# Patient Record
Sex: Male | Born: 1945
Health system: Southern US, Community
[De-identification: ages and names within clinical notes are randomized; demographics above are authoritative.]

## PROBLEM LIST (undated history)

## (undated) DIAGNOSIS — M1990 Unspecified osteoarthritis, unspecified site: Secondary | ICD-10-CM

## (undated) DIAGNOSIS — E785 Hyperlipidemia, unspecified: Secondary | ICD-10-CM

## (undated) DIAGNOSIS — Z9049 Acquired absence of other specified parts of digestive tract: Secondary | ICD-10-CM

## (undated) HISTORY — PX: APPENDECTOMY: SHX54

## (undated) HISTORY — DX: Acquired absence of other specified parts of digestive tract: Z90.49

## (undated) HISTORY — PX: HERNIA REPAIR: SHX51

## (undated) HISTORY — DX: Hyperlipidemia, unspecified: E78.5

## (undated) HISTORY — DX: Unspecified osteoarthritis, unspecified site: M19.90

---

## 2014-05-18 DIAGNOSIS — J209 Acute bronchitis, unspecified: Secondary | ICD-10-CM | POA: Diagnosis not present

## 2015-01-31 DIAGNOSIS — H5210 Myopia, unspecified eye: Secondary | ICD-10-CM | POA: Diagnosis not present

## 2015-01-31 DIAGNOSIS — H5203 Hypermetropia, bilateral: Secondary | ICD-10-CM | POA: Diagnosis not present

## 2015-06-15 DIAGNOSIS — J00 Acute nasopharyngitis [common cold]: Secondary | ICD-10-CM | POA: Diagnosis not present

## 2015-06-15 DIAGNOSIS — J3089 Other allergic rhinitis: Secondary | ICD-10-CM | POA: Diagnosis not present

## 2015-07-31 DEATH — deceased

## 2016-04-05 DIAGNOSIS — H6123 Impacted cerumen, bilateral: Secondary | ICD-10-CM | POA: Diagnosis not present

## 2016-04-26 DIAGNOSIS — Z1211 Encounter for screening for malignant neoplasm of colon: Secondary | ICD-10-CM | POA: Diagnosis not present

## 2016-04-26 DIAGNOSIS — Z86010 Personal history of colonic polyps: Secondary | ICD-10-CM | POA: Diagnosis not present

## 2016-06-28 DIAGNOSIS — K641 Second degree hemorrhoids: Secondary | ICD-10-CM | POA: Diagnosis not present

## 2016-06-28 DIAGNOSIS — K5730 Diverticulosis of large intestine without perforation or abscess without bleeding: Secondary | ICD-10-CM | POA: Diagnosis not present

## 2016-06-28 DIAGNOSIS — Z1211 Encounter for screening for malignant neoplasm of colon: Secondary | ICD-10-CM | POA: Diagnosis not present

## 2016-06-28 DIAGNOSIS — Z86010 Personal history of colonic polyps: Secondary | ICD-10-CM | POA: Diagnosis not present

## 2016-08-20 DIAGNOSIS — Q2731 Arteriovenous malformation of vessel of upper limb: Secondary | ICD-10-CM | POA: Diagnosis not present

## 2016-08-20 DIAGNOSIS — L728 Other follicular cysts of the skin and subcutaneous tissue: Secondary | ICD-10-CM | POA: Diagnosis not present

## 2016-10-07 DIAGNOSIS — D485 Neoplasm of uncertain behavior of skin: Secondary | ICD-10-CM | POA: Diagnosis not present

## 2016-10-07 DIAGNOSIS — Z6826 Body mass index (BMI) 26.0-26.9, adult: Secondary | ICD-10-CM | POA: Diagnosis not present

## 2016-10-14 DIAGNOSIS — L980 Pyogenic granuloma: Secondary | ICD-10-CM | POA: Diagnosis not present

## 2017-01-23 ENCOUNTER — Encounter: Payer: Self-pay | Admitting: Medical

## 2017-01-23 ENCOUNTER — Ambulatory Visit (HOSPITAL_BASED_OUTPATIENT_CLINIC_OR_DEPARTMENT_OTHER)
Admission: RE | Admit: 2017-01-23 | Discharge: 2017-01-23 | Disposition: A | Discharge status: Home / Self Care | Payer: Medicare HMO | Source: Ambulatory Visit | Attending: Medical | Admitting: Medical

## 2017-01-23 ENCOUNTER — Ambulatory Visit (INDEPENDENT_AMBULATORY_CARE_PROVIDER_SITE_OTHER): Payer: Medicare HMO | Admitting: Medical

## 2017-01-23 VITALS — BP 127/77 | HR 70 | Ht 72.0 in | Wt 205.4 lb

## 2017-01-23 DIAGNOSIS — M50223 Other cervical disc displacement at C6-C7 level: Secondary | ICD-10-CM | POA: Diagnosis not present

## 2017-01-23 DIAGNOSIS — E785 Hyperlipidemia, unspecified: Secondary | ICD-10-CM

## 2017-01-23 DIAGNOSIS — M542 Cervicalgia: Secondary | ICD-10-CM

## 2017-01-23 DIAGNOSIS — R42 Dizziness and giddiness: Secondary | ICD-10-CM | POA: Diagnosis not present

## 2017-01-23 DIAGNOSIS — M50323 Other cervical disc degeneration at C6-C7 level: Secondary | ICD-10-CM | POA: Diagnosis not present

## 2017-01-23 DIAGNOSIS — S46812A Strain of other muscles, fascia and tendons at shoulder and upper arm level, left arm, initial encounter: Secondary | ICD-10-CM

## 2017-01-23 MED ORDER — CYCLOBENZAPRINE HCL 5 MG PO TABS: 5.0000 mg | ORAL_TABLET | Freq: Every day | ORAL | 0 refills | Status: DC

## 2017-01-23 MED ORDER — MECLIZINE HCL 12.5 MG PO TABS: 12.5000 mg | ORAL_TABLET | Freq: Three times a day (TID) | ORAL | 0 refills | Status: DC | PRN

## 2017-01-23 NOTE — Patient Instructions (Addendum)
For your neck pain will get cervical spine x-ray.  A lot of your pain appeared to be in the left trapezius region.  I offered Toradol injfectin  today  but was declined.  If you change your mind please let us know and I could arrange a nurse office visit.  You can continue to use low-dose Advil over-the-counter and I am making a  prescription of Flexeril available to use at night.  If you were to get neck stiffness with headache then would advise emergency department evaluation.  Your dizziness by history and exam today support benign cause.  I do not think you have a central cause presently.  I would recommend trying Epley maneuver exercise and cautioned that you get your balance after changing positions(before you ambulate).  If with dizziness you get associated headache or neurologic signs or symptoms as we discussed today then be seen at the emergency department.  Also if your dizziness does not taper off I might consider CT of the head outpatient order.  I can also print prescription of meclizine.  If you have dizziness that persist/lasting than more than 5 minutes then you could try this.  Follow-up in 7-10 days or as needed.  We will call you on the x-ray results.    How to Perform the Epley Maneuver The Epley maneuver is an exercise that relieves symptoms of vertigo. Vertigo is the feeling that you or your surroundings are moving when they are not. When you feel vertigo, you may feel like the room is spinning and have trouble walking. Dizziness is a little different than vertigo. When you are dizzy, you may feel unsteady or light-headed. You can do this maneuver at home whenever you have symptoms of vertigo. You can do it up to 3 times a day until your symptoms go away. Even though the Epley maneuver may relieve your vertigo for a few weeks, it is possible that your symptoms will return. This maneuver relieves vertigo, but it does not relieve dizziness. What are the risks? If it is done correctly,  the Epley maneuver is considered safe. Sometimes it can lead to dizziness or nausea that goes away after a short time. If you develop other symptoms, such as changes in vision, weakness, or numbness, stop doing the maneuver and call your health care provider. How to perform the Epley maneuver 1. Sit on the edge of a bed or table with your back straight and your legs extended or hanging over the edge of the bed or table. 2. Turn your head halfway toward the affected ear or side. 3. Lie backward quickly with your head turned until you are lying flat on your back. You may want to position a pillow under your shoulders. 4. Hold this position for 30 seconds. You may experience an attack of vertigo. This is normal. 5. Turn your head to the opposite direction until your unaffected ear is facing the floor. 6. Hold this position for 30 seconds. You may experience an attack of vertigo. This is normal. Hold this position until the vertigo stops. 7. Turn your whole body to the same side as your head. Hold for another 30 seconds. 8. Sit back up. You can repeat this exercise up to 3 times a day. Follow these instructions at home:  After doing the Epley maneuver, you can return to your normal activities.  Ask your health care provider if there is anything you should do at home to prevent vertigo. He or she may recommend that  you: ? Keep your head raised (elevated) with two or more pillows while you sleep. ? Do not sleep on the side of your affected ear. ? Get up slowly from bed. ? Avoid sudden movements during the day. ? Avoid extreme head movement, like looking up or bending over. Contact a health care provider if:  Your vertigo gets worse.  You have other symptoms, including: ? Nausea. ? Vomiting. ? Headache. Get help right away if:  You have vision changes.  You have a severe or worsening headache or neck pain.  You cannot stop vomiting.  You have new numbness or weakness in any part of your  body. Summary  Vertigo is the feeling that you or your surroundings are moving when they are not.  The Epley maneuver is an exercise that relieves symptoms of vertigo.  If the Epley maneuver is done correctly, it is considered safe. You can do it up to 3 times a day. This information is not intended to replace advice given to you by your health care provider. Make sure you discuss any questions you have with your health care provider. Document Released: 03/23/2013 Document Revised: 02/06/2016 Document Reviewed: 02/06/2016 Elsevier Interactive Patient Education  2017 [employer].

## 2017-01-23 NOTE — Progress Notes (Addendum)
Subjective:    Patient ID: Sergio Chandler, male    DOB: Sep 19, 1945, 71 y.o.   MRN: [id number]  HPI  Pt in for first time. [facility]2 [facility] a [facility]. Pt also saw Dr. Cathi Roan. He was formerly living in [place].   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 Married.    Pt brings [facility] paperwork with him with some recent labs.  Pt will see Dr. Lorelei Pont on April 01, 2017.   Pt not on any medications.  He has history of well controlled blood pressure. He states he has some cholesterol level vary between 210-240. He does not want to be on any medications.   Pt mom passed away 38 years of age. Dad passed away 101 years.  Pt in with some neck pain. He states pain started on left side of his neck then eventually went to his rt side. Pt thinks not related to poor sleep position. No injury or fall. No mva. No hx of neck pain in past per pt. Pt tried so advil and it did not help much. Pain is all day. Pt states dull pain. Pain increased with head movement of any sort. No pain running to his arms. No numbness, no weakness and no tingling.  Pain has been going on in neck for 5 weeks with some radiating pain to both scapulas/and shoulders.  Pt states dull level pain.   Pt has intermittent rare episodes of dizziness. Last 3-5 seconds. Seems to correlate with head movement at times but not present today during interview.  Last time dizzy this morning was when he got out of bed.   No fever, no chills or sweat. No nausea or vomiting. No headache.  Hx of known arthritis hips and knees.   On review review no severe rapid changes.   Review of Systems  Constitutional: Negative for chills, fatigue and fever.  Respiratory: Negative for cough, chest tightness, shortness of breath and wheezing.   Cardiovascular: Negative for chest pain and palpitations.  Gastrointestinal: Negative for abdominal pain.  Genitourinary:  Negative for decreased urine volume, dysuria, flank pain, frequency, hematuria, penile pain, penile swelling, scrotal swelling and testicular pain.  Musculoskeletal: Positive for neck pain. Negative for back pain, joint swelling and neck stiffness.  Skin: Negative for rash.  Neurological: Positive for dizziness. Negative for syncope, speech difficulty, weakness, numbness and headaches.       Some vertigo on turning his head and transient changing his position.  Hematological: Negative for adenopathy. Does not bruise/bleed easily.  Psychiatric/Behavioral: Negative for behavioral problems, confusion, dysphoric mood and sleep disturbance. The patient is not nervous/anxious.     Past Medical History:  Diagnosis Date  . Arthritis   . Hyperlipidemia      Social History   Social History  . Marital status: Married    Spouse name: N/A  . Number of children: N/A  . Years of education: N/A   Occupational History  . Not on file.   Social History Main Topics  . Smoking status: Former Smoker    Packs/day: 2.00    Years: 28.00    Types: Cigarettes, Pipe, Cigars    Quit date: 01/24/1979  . Smokeless tobacco: Never Used  . Alcohol use Yes     Comment: 4-6 beers a day.  . Drug use: No  . Sexual activity: Not on file   Other Topics Concern  .  Not on file   Social History Narrative  . No narrative on file    Past Surgical History:  Procedure Laterality Date  . APPENDECTOMY    . HERNIA REPAIR Right     History reviewed. No pertinent family history.  Not on File  No current outpatient prescriptions on file prior to visit.   No current facility-administered medications on file prior to visit.     Ht 6' (1.829 m)   Wt 205 lb 6.4 oz (93.2 kg)   BMI 27.86 kg/m       Objective:   Physical Exam  General Mental Status- Alert. General Appearance- Not in acute distress.   Skin General: Color- Normal Color. Moisture- Normal Moisture.  Neck Carotid Arteries- Normal color.  Moisture- Normal Moisture. No carotid bruits. No JVD. Left side trapezius tenderness. Pain on range of motion. No mid cspine tenderness presently.   Chest and Lung Exam Auscultation: Breath Sounds:-Normal.  Cardiovascular Auscultation:Rythm- Regular. Murmurs & Other Heart Sounds:Auscultation of the heart reveals- No Murmurs.  Abdomen Inspection:-Inspeection Normal. Palpation/Percussion:Note:No mass. Palpation and Percussion of the abdomen reveal- Non Tender, Non Distended + BS, no rebound or guarding.    Neurologic Cranial Nerve exam:- CN III-XII intact(No nystagmus), symmetric smile. Drift Test:- No drift. Romberg Exam:- Negative.  Heal to Toe Gait exam:-poor for years. Knee arthritis throws off balance.No change per pt. Finger to Nose:- Normal/Intact Strength:- 5/5 equal and symmetric strength both upper and lower extremities.l         Assessment & Plan:  For your neck pain will get cervical spine x-ray.  A lot of your pain appeared to be in the left trapezius region.  I offered Toradol injfectin  today  but was declined.  If you change your mind please let us know and I could arrange a nurse office visit.  You can continue to use low-dose Advil over-the-counter and I am making a prescription of Flexeril available to use at night.  If you were to get neck stiffness with headache then would advise emergency department evaluation.  Your dizziness by history and exam today support benign cause.  I do not think you have a central cause presently.  I would recommend trying Epley maneuver exercise and cautioned that you get your balance after changing positions.  If with dizziness you get associated headache or neurologic signs or symptoms as we discussed today then be seen at the emergency department.  Also if your dizziness does not taper off I might consider CT of the head outpatient order.  I can also print prescription of meclizine.  If you have dizziness that persist/lasting than  more than 5 minutes then you could try this.  Follow-up in 7-10 days or as needed.  We will call you on the x-ray results.  Cliff Damiani, Percell Miller, PA-C

## 2017-01-24 ENCOUNTER — Telehealth: Payer: Self-pay

## 2017-01-24 NOTE — Telephone Encounter (Signed)
I called patient between patient's but did not get through.  However, I did leave a message on his answering machine.  Basically tried to explain that I would order an MRI if he had pain from his neck shooting towards either arm.  I try to explain that with radiating pain to his arms from his neck that insurances may authorize MRI under those circumstances.  Without any pain shooting to his arms, the test may be declined.  Also left a message asking if he had tried the Flexeril and if that helped his neck pain.  Would you try calling patient and touching base with him regarding the above.  Let me know since if he does have radiating pain to his arms I I feel like I could get MRI authorized.

## 2017-01-24 NOTE — Telephone Encounter (Signed)
Pt want to know of he will need and MRI CT Scan for neck pain. Please advise.

## 2017-01-27 NOTE — Telephone Encounter (Signed)
Relation to VQ:[id number] Call back number: [phone] Pharmacy:  Reason for call:  Patient states he spoke to insurance and they advised him, if PCP codes it as "x ray" then patient would be responsible for $10 and if he codes it as "MRI" he will be responsible for $220, please advise

## 2017-01-27 NOTE — Telephone Encounter (Signed)
Pt states he is more worried about the dizziness he is having and where is is coming from if he needs to do the MRI to find out he will do it.

## 2017-01-27 NOTE — Telephone Encounter (Signed)
Patient should be made aware that the advice he was given by insurance really does not make any sense.  That MRI is a totally different test than an x-ray.  MRI cannot be coded as an x-ray.  I can order the MRI code/reason for test would be neck pain.  Cost of MRI more expensive than the x-ray.  I am hesitant to order the test if he is under the assumption that it will cost him $10 and when test cost more then  he might take a coded the test wrong.   Patient of the above and that I would like to talk to him.  Would you get please call that number for him.

## 2017-01-28 ENCOUNTER — Telehealth: Payer: Self-pay | Admitting: Medical

## 2017-01-28 DIAGNOSIS — M542 Cervicalgia: Secondary | ICD-10-CM

## 2017-01-28 NOTE — Telephone Encounter (Signed)
Pt. States that he does not care what needs to be done with MRI and insurance. He needs to find out what the problem is and why he is dizzy. He just want to get something done.

## 2017-01-28 NOTE — Telephone Encounter (Signed)
Patient best contact [phone]

## 2017-01-28 NOTE — Telephone Encounter (Signed)
I put an MRI order for patient to have done.  Please try to get that authorized.  It probably is less expensive to do it somewhere else than here.  Patient was mentioning hospital-based charge etc.  So can you coordinate and get that done at a location that would be cheaper/best for him.  This is what he requests.

## 2017-01-29 ENCOUNTER — Encounter: Payer: Self-pay | Admitting: Medical

## 2017-01-30 NOTE — Telephone Encounter (Signed)
Pt mri results of cspine?

## 2017-01-30 NOTE — Telephone Encounter (Signed)
Patient's MRI c-spine is scheduled but it has not been done.  I will be out of the office next week and I think patient has appointment scheduled way out to see Dr. Edilia Bo but a month or 2 out?  He was expressing frustration about his continued neck pain and he was stating  he was still dizzy.   If he calls regarding persistent pain or dizziness try to get him in with 1 of the other providers when they are doctor of the day.  May be Dr. Edilia Bo has Thursday with opening?  Patient declined Toradol on the day that I saw him(wanted to see if that helped his neck pain)..  And I was considering may be getting CT of the head to further evaluate his dizziness if Epley maneuvers and meclizine did not help.  Please call him on Monday and see how he is doing?

## 2017-01-30 NOTE — Telephone Encounter (Signed)
Patient is scheduled w/ [facility] for 02/11/17

## 2017-01-31 NOTE — Telephone Encounter (Signed)
Pt notified and scheduled for CT

## 2017-02-05 DIAGNOSIS — H5203 Hypermetropia, bilateral: Secondary | ICD-10-CM | POA: Diagnosis not present

## 2017-02-11 ENCOUNTER — Ambulatory Visit
Admission: RE | Admit: 2017-02-11 | Discharge: 2017-02-11 | Disposition: A | Discharge status: Home / Self Care | Payer: Medicare HMO | Source: Ambulatory Visit | Attending: Medical | Admitting: Medical

## 2017-02-11 ENCOUNTER — Ambulatory Visit
Admission: RE | Admit: 2017-02-11 | Discharge: 2017-02-11 | Disposition: A | Discharge status: Home / Self Care | Payer: Medicare HMO | Source: Ambulatory Visit | Attending: Radiology | Admitting: Radiology

## 2017-02-11 ENCOUNTER — Other Ambulatory Visit: Payer: Self-pay | Admitting: Radiology

## 2017-02-11 DIAGNOSIS — M4802 Spinal stenosis, cervical region: Secondary | ICD-10-CM | POA: Diagnosis not present

## 2017-02-11 DIAGNOSIS — M542 Cervicalgia: Secondary | ICD-10-CM

## 2017-02-11 DIAGNOSIS — Z0189 Encounter for other specified special examinations: Secondary | ICD-10-CM

## 2017-02-11 DIAGNOSIS — Z1389 Encounter for screening for other disorder: Secondary | ICD-10-CM

## 2017-02-11 DIAGNOSIS — Z01818 Encounter for other preprocedural examination: Secondary | ICD-10-CM | POA: Diagnosis not present

## 2017-02-17 ENCOUNTER — Ambulatory Visit: Payer: Medicare HMO | Admitting: Medical

## 2017-02-17 ENCOUNTER — Encounter: Payer: Self-pay | Admitting: Medical

## 2017-02-17 VITALS — BP 117/77 | HR 70 | Temp 97.6°F | Resp 16 | Ht 74.0 in | Wt 207.0 lb

## 2017-02-17 DIAGNOSIS — M542 Cervicalgia: Secondary | ICD-10-CM

## 2017-02-17 MED ORDER — TRAMADOL HCL 50 MG PO TABS: 50.0000 mg | ORAL_TABLET | Freq: Four times a day (QID) | ORAL | 0 refills | Status: DC | PRN

## 2017-02-17 NOTE — Progress Notes (Signed)
Subjective:    Patient ID: Sergio Chandler, male    DOB: [birth year], 71 y.o.   MRN: [id number]  HPI  Pt in for persisting neck pain. Mri reviewed with pt. Multiple findings explained with pt. He better understanding of why he may be in pain. No radiating pain to his arms.   He is willing to see Dr. Ellene Route neurosurgeon. He has appointment late December.  Below explained to hm.  1. Widespread chronic cervical disc and endplate degeneration with no No acute osseous abnormality identified. Intermittent posterior element degeneration, maximal at C5-C6 and C6-C7. 2. Multilevel mild cervical spinal stenosis with no definite spinal cord mass effect. No cord signal abnormality. 3. Widespread moderate cervical neural foraminal stenosis, and severe stenosis at the left C6 nerve level.    Pt states pain is not that severe. He did not like the way muscle relaxant made him feel.  Pt states in past with pain would just use advil. 2 tabs and is adequate for other pains.  Pt recent dizziness he explained is now resolved. Only very transient for seconds 5 or so then resolves on and off. Not associated with any gross motor or neurologic signs or symptoms.   Review of Systems  Constitutional: Negative for chills, fatigue and fever.  Respiratory: Negative for chest tightness, shortness of breath and wheezing.   Cardiovascular: Negative for chest pain and palpitations.  Gastrointestinal: Negative for abdominal pain, constipation, diarrhea and vomiting.  Musculoskeletal: Positive for neck pain. Negative for arthralgias, back pain and joint swelling.  Skin: Negative for rash.  Neurological: Negative for dizziness, syncope, numbness and headaches.  Hematological: Negative for adenopathy. Does not bruise/bleed easily.  Psychiatric/Behavioral: Negative for behavioral problems, decreased concentration, self-injury and suicidal ideas. The patient is not nervous/anxious.     Past Medical History:    Diagnosis Date  . Arthritis   . Hyperlipidemia      Social History   Socioeconomic History  . Marital status: Married    Spouse name: Not on file  . Number of children: Not on file  . Years of education: Not on file  . Highest education level: Not on file  Social Needs  . Financial resource strain: Not on file  . Food insecurity - worry: Not on file  . Food insecurity - inability: Not on file  . Transportation needs - medical: Not on file  . Transportation needs - non-medical: Not on file  Occupational History  . Not on file  Tobacco Use  . Smoking status: Former Smoker    Packs/day: 2.00    Years: 28.00    Pack years: 56.00    Types: Cigarettes, Pipe, Cigars    Last attempt to quit: 01/24/1979    Years since quitting: 38.0  . Smokeless tobacco: Never Used  Substance and Sexual Activity  . Alcohol use: Yes    Comment: 4-6 beers a day.  . Drug use: No  . Sexual activity: Not on file  Other Topics Concern  . Not on file  Social History Narrative  . Not on file    Past Surgical History:  Procedure Laterality Date  . APPENDECTOMY    . HERNIA REPAIR Right     No family history on file.  Not on File  Current Outpatient Medications on File Prior to Visit  Medication Sig Dispense Refill  . cyclobenzaprine (FLEXERIL) 5 MG tablet Take 1 tablet (5 mg total) by mouth at bedtime. 10 tablet 0  . meclizine (ANTIVERT) 12.5 MG  tablet Take 1 tablet (12.5 mg total) by mouth 3 (three) times daily as needed for dizziness. 30 tablet 0   No current facility-administered medications on file prior to visit.     BP 117/77   Pulse 70   Temp 97.6 F (36.4 C)   Resp 16   Ht 6\' 2"  (1.88 m)   Wt 207 lb (93.9 kg)   SpO2 99%   BMI 26.58 kg/m       Objective:   Physical Exam   General Mental Status- Alert. General Appearance- Not in acute distress.   Skin General: Color- Normal Color. Moisture- Normal Moisture.  Neck Carotid Arteries- Normal color. Moisture- Normal  Moisture. No carotid bruits. No JVD. No mid cervical or trapezius tenderness presently.  Chest and Lung Exam Auscultation: Breath Sounds:-Normal.  Cardiovascular Auscultation:Rythm- Regular. Murmurs & Other Heart Sounds:Auscultation of the heart reveals- No Murmurs.  Abdomen Inspection:-Inspeection Normal. Palpation/Percussion:Note:No mass. Palpation and Percussion of the abdomen reveal- Non Tender, Non Distended + BS, no rebound or guarding.   Neurologic Cranial Nerve exam:- CN III-XII intact(No nystagmus), symmetric smile. Strength:- 5/5 equal and symmetric strength both upper and lower extremities.     Assessment & Plan:  For neck pain mild-moderate at time can use advil. If worse or more severe pain making tramadol available.(rx advisement given). Refer to Dr. Ellene Route.  If your dizziness becomes more constant or severe please let us know. If any dizziness with motor or sensory deficits then ED evaluation.  Follow up with [place] as needed post neurosurgeon appointment.

## 2017-02-17 NOTE — Patient Instructions (Signed)
For neck pain mild-moderate at time can use advil. If worse or more severe pain making tramadol available.(rx advisement given). Refer to Dr. Ellene Route.  If your dizziness becomes more constant or severe please let us know. If any dizziness with motor or sensory deficits then ED evaluation.  Follow up with [place] as needed post neurosurgeon appointment.

## 2017-03-04 DIAGNOSIS — M47812 Spondylosis without myelopathy or radiculopathy, cervical region: Secondary | ICD-10-CM | POA: Diagnosis not present

## 2017-03-07 DIAGNOSIS — M542 Cervicalgia: Secondary | ICD-10-CM | POA: Diagnosis not present

## 2017-03-07 DIAGNOSIS — M47892 Other spondylosis, cervical region: Secondary | ICD-10-CM | POA: Diagnosis not present

## 2017-03-13 DIAGNOSIS — H6123 Impacted cerumen, bilateral: Secondary | ICD-10-CM | POA: Diagnosis not present

## 2017-03-18 DIAGNOSIS — G629 Polyneuropathy, unspecified: Secondary | ICD-10-CM | POA: Diagnosis not present

## 2017-03-18 DIAGNOSIS — R29898 Other symptoms and signs involving the musculoskeletal system: Secondary | ICD-10-CM | POA: Diagnosis not present

## 2017-03-18 DIAGNOSIS — M9981 Other biomechanical lesions of cervical region: Secondary | ICD-10-CM | POA: Diagnosis not present

## 2017-03-19 DIAGNOSIS — M542 Cervicalgia: Secondary | ICD-10-CM | POA: Diagnosis not present

## 2017-03-19 DIAGNOSIS — M47892 Other spondylosis, cervical region: Secondary | ICD-10-CM | POA: Diagnosis not present

## 2017-03-27 DIAGNOSIS — M542 Cervicalgia: Secondary | ICD-10-CM | POA: Diagnosis not present

## 2017-03-27 DIAGNOSIS — M47892 Other spondylosis, cervical region: Secondary | ICD-10-CM | POA: Diagnosis not present

## 2017-04-02 DIAGNOSIS — M542 Cervicalgia: Secondary | ICD-10-CM | POA: Diagnosis not present

## 2017-04-02 DIAGNOSIS — M47892 Other spondylosis, cervical region: Secondary | ICD-10-CM | POA: Diagnosis not present

## 2017-04-09 NOTE — Progress Notes (Signed)
Sergio Chandler at [facility] [address], [place], [place] [address] [phone]  Date:  04/10/2017   Name:  Sergio Chandler   DOB:  06/10/1945   MRN:  [id number]  PCP:  Sergio Mclean, MD    Chief Complaint: Establish Care (Pt states he has no questions or concerns. Reestablishing care. )   History of Present Illness:  Sergio Chandler is a 72 y.o. very pleasant male patient who presents with the following:  Here today as a new patient to establish care History of hyperlipidemia and arthritis  Pt declines all immunizations  He had an MRI of his spine in November: IMPRESSION: 1. Widespread chronic cervical disc and endplate degeneration with no No acute osseous abnormality identified. Intermittent posterior element degeneration, maximal at C5-C6 and C6-C7. 2. Multilevel mild cervical spinal stenosis with no definite spinal cord mass effect. No cord signal abnormality. 3. Widespread moderate cervical neural foraminal stenosis, and severe stenosis at the left C6 nerve level.  He has been referred to see NSG, and is also seeing PT through [facility]  He moved here last fall- he moved from the [place] area. They sold their home and got a townhouse, downsizing.   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   He also sees the [facility] in [place] once a year, and has labs per them He notes that his cholesterol goes "up and down," but he does not take any medication for this  He does take fish oil He had an exlap back in [place]- he had a lacerated liver He was in [place] for about 4 months His mother and father are deceased, he has one remaining sister living His parents lived to 26 (mom) and 81 (dad)  He does have 2 sons, no grands  In his free time he likes to travel.    He has seen NSG, no operation needed, he is doing PT. His neck is feeling better He does not feel as dizzy as he did in the past, overall this is better.   His last colon was last year- he was asked to come back in 10 years  He does not do a lot of exercise, but he does do some walking when it suits him- when he is working or traveling  He enjoys traveling via [place]- this spring they are planing on [place] and [place], [place] then [place].  Next year they will see [place] Patient Active Problem List   Diagnosis Date Noted  . 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 Arthritis   . History of appendectomy   . Hyperlipidemia     Past Surgical History:  Procedure Laterality Date  . APPENDECTOMY    . HERNIA REPAIR Right     Social History   Tobacco Use  . Smoking status: Former Smoker    Packs/day: 2.00    Years: 28.00    Pack years: 56.00    Types: Cigarettes, Pipe, Cigars    Last attempt to quit: 01/24/1979    Years since quitting: 38.2  . Smokeless tobacco: Never Used  Substance Use Topics  . Alcohol use: Yes    Comment: 4-6 beers a day.  . Drug use:  No    No family history on file.  Not on File  Medication list has been reviewed and updated.  Current Outpatient Medications on File Prior to Visit  Medication Sig Dispense Refill  . traMADol (ULTRAM) 50 MG tablet Take 1 tablet (50 mg total) every 6 (six) hours as needed by mouth. 20 tablet 0   No current facility-administered medications on file prior to visit.     Review of Systems:  As per HPI- otherwise negative.   Physical Examination: Vitals:   04/10/17 0830  BP: 110/76  Pulse: 80  Temp: 97.8 F (36.6 C)  SpO2: 98%   Vitals:   04/10/17 0830  Weight: 210 lb (95.3 kg)  Height: 6' (1.829 m)   Body mass index is 28.48 kg/m. Ideal Body Weight: Weight in (lb) to have BMI = 25: 183.9  GEN: WDWN, NAD, Non-toxic, A & O x 3, looks  well, mild overweight HEENT: Atraumatic, Normocephalic. Neck supple. No masses, No LAD.  Bilateral TM wnl with some wax in canal, oropharynx normal.  PEERL,EOMI.   Ears and Nose: No external deformity. CV: RRR, No M/G/R. No JVD. No thrill. No extra heart sounds. PULM: CTA B, no wheezes, crackles, rhonchi. No retractions. No resp. distress. No accessory muscle use. ABD: S, NT, ND. No rebound. No HSM. EXTR: No c/c/e NEURO Normal gait.  PSYCH: Normally interactive. Conversant. Not depressed or anxious appearing.  Calm demeanor.    Assessment and Plan: Other osteoarthritis of spine, cervical region  Here today to establish care with me.  He does not have any concerns today Much of his care is through the [facility]- he wants to have someone local as well   Signed Lamar Blinks, MD

## 2017-04-10 ENCOUNTER — Encounter: Payer: Self-pay | Admitting: Family Medicine

## 2017-04-10 ENCOUNTER — Ambulatory Visit: Payer: Medicare HMO | Admitting: Family Medicine

## 2017-04-10 VITALS — BP 110/76 | HR 80 | Temp 97.8°F | Ht 72.0 in | Wt 210.0 lb

## 2017-04-10 DIAGNOSIS — M479 Spondylosis, unspecified: Secondary | ICD-10-CM | POA: Insufficient documentation

## 2017-04-10 DIAGNOSIS — M47892 Other spondylosis, cervical region: Secondary | ICD-10-CM | POA: Diagnosis not present

## 2017-04-10 NOTE — Patient Instructions (Addendum)
It was very nice to meet you today and hear about your travels!  Take care and let me know if there is anything we can do for you

## 2017-12-30 DIAGNOSIS — D225 Melanocytic nevi of trunk: Secondary | ICD-10-CM | POA: Diagnosis not present

## 2017-12-30 DIAGNOSIS — L821 Other seborrheic keratosis: Secondary | ICD-10-CM | POA: Diagnosis not present

## 2017-12-30 DIAGNOSIS — L570 Actinic keratosis: Secondary | ICD-10-CM | POA: Diagnosis not present

## 2017-12-30 DIAGNOSIS — X32XXXD Exposure to sunlight, subsequent encounter: Secondary | ICD-10-CM | POA: Diagnosis not present

## 2018-02-05 DIAGNOSIS — H5203 Hypermetropia, bilateral: Secondary | ICD-10-CM | POA: Diagnosis not present

## 2018-02-11 LAB — LIPID PANEL
CHOLESTEROL: 162 (ref 0–200)
HDL: 60 (ref 35–70)
LDL CALC: 87
TRIGLYCERIDES: 77 (ref 40–160)

## 2018-02-11 LAB — HEPATIC FUNCTION PANEL
ALK PHOS: 85 (ref 25–125)
ALT: 45 — AB (ref 10–40)
AST: 22 (ref 14–40)
BILIRUBIN DIRECT: 0.3 (ref 0.01–0.4)

## 2018-02-11 LAB — BASIC METABOLIC PANEL
BUN: 11 (ref 4–21)
CREATININE: 0.8 (ref 0.6–1.3)
Glucose: 109

## 2018-02-11 LAB — CBC AND DIFFERENTIAL
HCT: 44 (ref 41–53)
HEMOGLOBIN: 14.8 (ref 13.5–17.5)
Platelets: 219 (ref 150–399)
WBC: 5.8

## 2018-03-11 ENCOUNTER — Telehealth: Payer: Self-pay | Admitting: Family Medicine

## 2018-03-11 NOTE — Telephone Encounter (Signed)
Patient came in and dropped off blood test results from the [facility] on 03/11/18

## 2018-03-15 ENCOUNTER — Encounter: Payer: Self-pay | Admitting: Family Medicine

## 2018-03-20 ENCOUNTER — Encounter: Payer: Self-pay | Admitting: Family Medicine

## 2018-03-20 LAB — CALCIUM: CALCIUM: 9.1

## 2018-03-20 LAB — CHLORIDE: CHLORIDE: 104

## 2018-03-20 LAB — CO2, TOTAL: Carbon Dioxide, Total: 31

## 2018-03-20 LAB — ESTIMATED GFR

## 2018-05-01 DIAGNOSIS — D2339 Other benign neoplasm of skin of other parts of face: Secondary | ICD-10-CM | POA: Diagnosis not present

## 2018-05-01 DIAGNOSIS — C44622 Squamous cell carcinoma of skin of right upper limb, including shoulder: Secondary | ICD-10-CM | POA: Diagnosis not present

## 2018-05-25 ENCOUNTER — Telehealth: Payer: Self-pay | Admitting: Family Medicine

## 2018-05-25 ENCOUNTER — Ambulatory Visit (INDEPENDENT_AMBULATORY_CARE_PROVIDER_SITE_OTHER): Payer: Medicare HMO | Admitting: Medical

## 2018-05-25 ENCOUNTER — Ambulatory Visit (HOSPITAL_BASED_OUTPATIENT_CLINIC_OR_DEPARTMENT_OTHER)
Admission: RE | Admit: 2018-05-25 | Discharge: 2018-05-25 | Disposition: A | Discharge status: Home / Self Care | Payer: Medicare HMO | Source: Ambulatory Visit | Attending: Medical | Admitting: Medical

## 2018-05-25 ENCOUNTER — Encounter: Payer: Self-pay | Admitting: Medical

## 2018-05-25 VITALS — BP 124/75 | HR 75 | Temp 98.0°F | Resp 16 | Ht 72.0 in | Wt 212.4 lb

## 2018-05-25 DIAGNOSIS — J40 Bronchitis, not specified as acute or chronic: Secondary | ICD-10-CM

## 2018-05-25 DIAGNOSIS — R61 Generalized hyperhidrosis: Secondary | ICD-10-CM

## 2018-05-25 DIAGNOSIS — R059 Cough, unspecified: Secondary | ICD-10-CM

## 2018-05-25 DIAGNOSIS — E785 Hyperlipidemia, unspecified: Secondary | ICD-10-CM | POA: Diagnosis not present

## 2018-05-25 DIAGNOSIS — R05 Cough: Secondary | ICD-10-CM

## 2018-05-25 DIAGNOSIS — R042 Hemoptysis: Secondary | ICD-10-CM | POA: Diagnosis not present

## 2018-05-25 LAB — COMPREHENSIVE METABOLIC PANEL
ALK PHOS: 78 U/L (ref 39–117)
ALT: 20 U/L (ref 0–53)
AST: 15 U/L (ref 0–37)
Albumin: 4.1 g/dL (ref 3.5–5.2)
BILIRUBIN TOTAL: 0.9 mg/dL (ref 0.2–1.2)
BUN: 9 mg/dL (ref 6–23)
CO2: 32 meq/L (ref 19–32)
CREATININE: 0.73 mg/dL (ref 0.40–1.50)
Calcium: 9.4 mg/dL (ref 8.4–10.5)
Chloride: 103 mEq/L (ref 96–112)
GFR: 104.72 mL/min (ref >60.00)
GLUCOSE: 107 mg/dL — AB (ref 70–99)
Potassium: 4.3 mEq/L (ref 3.5–5.1)
SODIUM: 140 meq/L (ref 135–145)
TOTAL PROTEIN: 6.2 g/dL (ref 6.0–8.3)

## 2018-05-25 LAB — CBC WITH DIFFERENTIAL/PLATELET
BASOS ABS: 0 10*3/uL (ref 0.0–0.1)
Basophils Relative: 0.7 % (ref 0.0–3.0)
EOS ABS: 0.2 10*3/uL (ref 0.0–0.7)
Eosinophils Relative: 3.3 % (ref 0.0–5.0)
HCT: 43 % (ref 39.0–52.0)
Hemoglobin: 14.8 g/dL (ref 13.0–17.0)
LYMPHS ABS: 1.4 10*3/uL (ref 0.7–4.0)
Lymphocytes Relative: 24.9 % (ref 12.0–46.0)
MCHC: 34.4 g/dL (ref 30.0–36.0)
MCV: 88.6 fl (ref 78.0–100.0)
MONO ABS: 0.6 10*3/uL (ref 0.1–1.0)
MONOS PCT: 10.7 % (ref 3.0–12.0)
NEUTROS ABS: 3.4 10*3/uL (ref 1.4–7.7)
NEUTROS PCT: 60.4 % (ref 43.0–77.0)
PLATELETS: 205 10*3/uL (ref 150.0–400.0)
RBC: 4.85 Mil/uL (ref 4.22–5.81)
RDW: 13.1 % (ref 11.5–15.5)
WBC: 5.6 10*3/uL (ref 4.0–10.5)

## 2018-05-25 LAB — LIPID PANEL
CHOL/HDL RATIO: 3
Cholesterol: 164 mg/dL (ref 0–200)
HDL: 56.3 mg/dL (ref >39.00)
LDL Cholesterol: 91 mg/dL (ref 0–99)
NONHDL: 108.05
Triglycerides: 84 mg/dL (ref 0.0–149.0)
VLDL: 16.8 mg/dL (ref 0.0–40.0)

## 2018-05-25 MED ORDER — ALBUTEROL SULFATE HFA 108 (90 BASE) MCG/ACT IN AERS: 2.0000 | INHALATION_SPRAY | Freq: Four times a day (QID) | RESPIRATORY_TRACT | 2 refills | Status: DC | PRN

## 2018-05-25 MED ORDER — DOXYCYCLINE HYCLATE 100 MG PO TABS: ORAL_TABLET | ORAL | 0 refills | Status: DC

## 2018-05-25 MED ORDER — PREDNISONE 10 MG PO TABS: ORAL_TABLET | ORAL | 0 refills | Status: DC

## 2018-05-25 MED ORDER — BENZONATATE 100 MG PO CAPS: 100.0000 mg | ORAL_CAPSULE | Freq: Three times a day (TID) | ORAL | 0 refills | Status: DC | PRN

## 2018-05-25 NOTE — Patient Instructions (Addendum)
You appear to have bronchitis. Rest hydrate and tylenol for fever. I am prescribing cough medicine benzonatate and doxycycline  antibiotic. For nasal congestion rx flonase.  Will get chest xray today to evaluate if you have pneumonia.  For sob or wheezing, will rx albuterol inhaler. Making print rx prednisone available to use if needed for wheezing or shortness of breath if needed.  Will get labs today cbc, cmp and lipid panel.   Follow up in 7-10 days or as needed

## 2018-05-25 NOTE — Telephone Encounter (Signed)
Called [facility] back and they report they already received an answer from [place].

## 2018-05-25 NOTE — Telephone Encounter (Signed)
Copied from [place] [id number]. Topic: Quick Communication - See Telephone Encounter >> May 25, 2018  9:41 AM Bea Graff, NT wrote: CRM for notification. See Telephone encounter for: 05/25/18. [facility] calling and states they need the directions of how the pt should take the doxycycline (VIBRA-TABS) 100 MG tablet before they can fill. Please advise. [place], [place] [phone] (Phone) [phone] (Fax)

## 2018-05-25 NOTE — Telephone Encounter (Signed)
Copied from [place] [id number]. Topic: Quick Communication - Rx Refill/Question >> May 25, 2018  1:07 PM Windy Kalata wrote: Medication: doxycycline (VIBRA-TABS) 100 MG   Has the patient contacted their pharmacy? Yes.   (Agent: If no, request that the patient contact the pharmacy for the refill.) (Agent: If yes, when and what did the pharmacy advise?) Pharmacy needs a call with how to dispense  Preferred Pharmacy (with phone number or street name): [facility] [place], [place] [phone] (Phone) [phone] (Fax)    Agent: Please be advised that RX refills may take up to 3 business days. We ask that you follow-up with your pharmacy.

## 2018-05-25 NOTE — Telephone Encounter (Signed)
I called and clarified- 1 twice a day for 10 days

## 2018-05-25 NOTE — Progress Notes (Signed)
Subjective:    Patient ID: Sergio Chandler, male    DOB: Jun 12, 1945, 73 y.o.   MRN: [id number]  HPI  Pt in for some recent nasal and chest congestion. Pt states he was up in [place] working over past 2 weeks. He states mostly past week chest congestion is worse. He state he has saliva, mucus and blood mixed productive cough. 50% he said was blood on Friday(since then no recurrent hemoptysis). He states has felt more tired that usual. He was feeling more short of breath intermittently. Some sweating. Pt is former smoker.  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   Review of Systems  Constitutional: Positive for diaphoresis and fatigue. Negative for chills and fever.  HENT: Positive for congestion. Negative for ear pain and postnasal drip.   Respiratory: Positive for cough and shortness of breath. Negative for chest tightness and wheezing.   Cardiovascular: Negative for chest pain and palpitations.  Gastrointestinal: Negative for abdominal pain, nausea and vomiting.  Musculoskeletal: Negative for back pain and neck pain.  Skin: Negative for rash.  Neurological: Negative for dizziness, seizures, weakness and light-headedness.  Hematological: Negative for adenopathy. Does not bruise/bleed easily.  Psychiatric/Behavioral: Negative for behavioral problems and confusion.    Past Medical History:  Diagnosis Date  . Arthritis   . History of appendectomy   . Hyperlipidemia      Social History   Socioeconomic History  . Marital status: Married    Spouse name: Not on file  . Number of children: Not on file  . Years of education: Not on file  . Highest education level: Not on file  Occupational History  . Not on file  Social Needs  . Financial resource strain: Not on file  . Food insecurity:    Worry: Not on file    Inability: Not on file  . Transportation needs:    Medical: Not on file    Non-medical: Not on file  Tobacco Use  .  Smoking status: Former Smoker    Packs/day: 2.00    Years: 28.00    Pack years: 56.00    Types: Cigarettes, Pipe, Cigars    Last attempt to quit: 01/24/1979    Years since quitting: 39.3  . Smokeless tobacco: Never Used  Substance and Sexual Activity  . Alcohol use: Yes    Comment: 4-6 beers a day.  . Drug use: No  . Sexual activity: Not on file  Lifestyle  . Physical activity:    Days per week: Not on file    Minutes per session: Not on file  . Stress: Not on file  Relationships  . Social connections:    Talks on phone: Not on file    Gets together: Not on file    Attends religious service: Not on file    Active member of club or organization: Not on file    Attends meetings of clubs or organizations: Not on file    Relationship status: Not on file  . Intimate partner violence:    Fear of current or ex partner: Not on file    Emotionally abused: Not on file    Physically abused: Not on file    Forced sexual activity: Not on file  Other Topics Concern  . Not on file  Social History Narrative  . Not on file    Past Surgical History:  Procedure Laterality Date  . APPENDECTOMY    . HERNIA  REPAIR Right     No family history on file.  Not on File  Current Outpatient Medications on File Prior to Visit  Medication Sig Dispense Refill  . atorvastatin (LIPITOR) 20 MG tablet Take 20 mg by mouth daily.     No current facility-administered medications on file prior to visit.     BP 124/75   Pulse 75   Temp 98 F (36.7 C) (Oral)   Resp 16   Ht 6' (1.829 m)   Wt 212 lb 6.4 oz (96.3 kg)   SpO2 100%   BMI 28.81 kg/m       Objective:   Physical Exam  General  Mental Status - Alert. General Appearance - Well groomed. Not in acute distress.  Skin Rashes- No Rashes.  HEENT Head- Normal. Ear Auditory Canal - Left- Normal. Right - Normal.Tympanic Membrane- Left- Normal. Right- Normal. Eye Sclera/Conjunctiva- Left- Normal. Right- Normal. Nose & Sinuses Nasal  Mucosa- Left-  Boggy and Congested. Right-  Boggy and  Congested.Bilateral maxillary and frontal sinus pressure. Mouth & Throat Lips: Upper Lip- Normal: no dryness, cracking, pallor, cyanosis, or vesicular eruption. Lower Lip-Normal: no dryness, cracking, pallor, cyanosis or vesicular eruption. Buccal Mucosa- Bilateral- No Aphthous ulcers. Oropharynx- No Discharge or Erythema. Tonsils: Characteristics- Bilateral- No Erythema or Congestion. Size/Enlargement- Bilateral- No enlargement. Discharge- bilateral-None.  Neck Neck- Supple. No Masses.   Chest and Lung Exam Auscultation: Breath Sounds:-Clear even and unlabored.  Cardiovascular Auscultation:Rythm- Regular, rate and rhythm. Murmurs & Other Heart Sounds:Ausculatation of the heart reveal- No Murmurs.  Lymphatic Head & Neck General Head & Neck Lymphatics: Bilateral: Description- No Localized lymphadenopathy.       Assessment & Plan:  You appear to have bronchitis. Rest hydrate and tylenol for fever. I am prescribing cough medicine benzonatate and doxycycline  antibiotic. For nasal congestion rx flonase.  Will get chest xray today to evaluate if you have pneumonia.  For sob or wheezing, will rx albuterol inhaler. Making print rx prednisone available to use if needed for wheezing or shortness of breath if needed.  Will get labs today cbc, cmp and lipid panel.   Follow up in 7-10 days or as needed  [employer], [employer]

## 2019-02-09 LAB — HEPATIC FUNCTION PANEL
ALT: 36 (ref 10–40)
AST: 19 (ref 14–40)
Alkaline Phosphatase: 92 (ref 25–125)
Bilirubin, Direct: 0.3 (ref 0.01–0.4)
Bilirubin, Total: 1.3

## 2019-02-09 LAB — CBC AND DIFFERENTIAL
HCT: 46 (ref 41–53)
Hemoglobin: 15.6 (ref 13.5–17.5)
Platelets: 237 (ref 150–399)
WBC: 6.3

## 2019-02-09 LAB — BASIC METABOLIC PANEL
BUN: 12 (ref 4–21)
CO2: 30 — AB (ref 13–22)
Chloride: 104 (ref 99–108)
Creatinine: 0.8 (ref 0.6–1.3)
Glucose: 106
Potassium: 4.7 (ref 3.4–5.3)
Sodium: 140 (ref 137–147)

## 2019-02-09 LAB — LIPID PANEL
Cholesterol: 170 (ref 0–200)
HDL: 74 — AB (ref 35–70)
LDL Cholesterol: 78
Triglycerides: 88 (ref 40–160)

## 2019-02-09 LAB — PSA: PSA: 6.9

## 2019-02-09 LAB — VITAMIN D 25 HYDROXY (VIT D DEFICIENCY, FRACTURES): Vit D, 25-Hydroxy: 31.01

## 2019-02-09 LAB — COMPREHENSIVE METABOLIC PANEL
Albumin: 3.9 (ref 3.5–5.0)
Calcium: 9.3 (ref 8.7–10.7)

## 2019-04-12 ENCOUNTER — Ambulatory Visit: Payer: Medicare Other | Attending: Internal Medicine

## 2019-04-12 DIAGNOSIS — Z23 Encounter for immunization: Secondary | ICD-10-CM | POA: Insufficient documentation

## 2019-04-12 NOTE — Progress Notes (Signed)
   Covid-19 Vaccination Clinic  Name:  Sergio Chandler    MRN: [id number] DOB: May 04, 1945  04/12/2019  [name] was observed post Covid-19 immunization for 15 minutes without incidence. He was provided with Vaccine Information Sheet and instruction to access the V-Safe system.   [name] was instructed to call 911 with any severe reactions post vaccine: Marland Kitchen Difficulty breathing  . Swelling of your face and throat  . A fast heartbeat  . A bad rash all over your body  . Dizziness and weakness    Immunizations Administered    Name Date Dose VIS Date Route   Pfizer COVID-19 Vaccine 04/12/2019  9:08 AM 0.3 mL 03/12/2019 Intramuscular   Manufacturer: [employer], [employer]   Lot: [id number]   [place]: [id number]

## 2019-05-01 ENCOUNTER — Ambulatory Visit: Payer: Medicare HMO | Attending: Internal Medicine

## 2019-05-01 DIAGNOSIS — Z23 Encounter for immunization: Secondary | ICD-10-CM | POA: Insufficient documentation

## 2019-05-01 NOTE — Progress Notes (Signed)
   Covid-19 Vaccination Clinic  Name:  Sergio Chandler    MRN: [id number] DOB: Jul 22, 1945  05/01/2019  Mr. Sergio Chandler was observed post Covid-19 immunization for 15 minutes without incidence. He was provided with Vaccine Information Sheet and instruction to access the V-Safe system.   Mr. Sergio Chandler was instructed to call 911 with any severe reactions post vaccine: Marland Kitchen Difficulty breathing  . Swelling of your face and throat  . A fast heartbeat  . A bad rash all over your body  . Dizziness and weakness    Immunizations Administered    Name Date Dose VIS Date Route   Pfizer COVID-19 Vaccine 05/01/2019 11:11 AM 0.3 mL 03/12/2019 Intramuscular   Manufacturer: [place]   Lot: [id number]   [place]: [id number]

## 2019-05-02 ENCOUNTER — Ambulatory Visit: Payer: Medicare HMO

## 2019-05-16 DIAGNOSIS — G8929 Other chronic pain: Secondary | ICD-10-CM | POA: Diagnosis not present

## 2019-05-16 DIAGNOSIS — M25561 Pain in right knee: Secondary | ICD-10-CM | POA: Diagnosis not present

## 2019-05-16 DIAGNOSIS — M1711 Unilateral primary osteoarthritis, right knee: Secondary | ICD-10-CM | POA: Diagnosis not present

## 2019-08-05 DIAGNOSIS — H5203 Hypermetropia, bilateral: Secondary | ICD-10-CM | POA: Diagnosis not present

## 2019-08-05 DIAGNOSIS — H40013 Open angle with borderline findings, low risk, bilateral: Secondary | ICD-10-CM | POA: Diagnosis not present

## 2019-08-10 LAB — CBC AND DIFFERENTIAL
HCT: 45 (ref 41–53)
Hemoglobin: 15.6 (ref 13.5–17.5)
WBC: 6

## 2019-08-10 LAB — LIPID PANEL
Cholesterol: 165 (ref 0–200)
HDL: 68 (ref 35–70)
LDL Cholesterol: 80
Triglycerides: 87 (ref 40–160)

## 2019-08-10 LAB — BASIC METABOLIC PANEL
BUN: 10 (ref 4–21)
Creatinine: 0.8 (ref 0.6–1.3)

## 2019-08-10 LAB — PSA: PSA: 6.5

## 2019-08-10 LAB — COMPREHENSIVE METABOLIC PANEL: GFR calc non Af Amer: 60

## 2019-09-10 ENCOUNTER — Encounter: Payer: Self-pay | Admitting: Family Medicine

## 2019-09-25 IMAGING — DX DG CERVICAL SPINE 2 OR 3 VIEWS
4 series · 4 of 4 positions shown · non-contrast
Comparison: None.

CLINICAL DATA: Neck pain, stiffness, some dizziness over the last
month

EXAM:
CERVICAL SPINE - 2-3 VIEW

[c-spine lat]
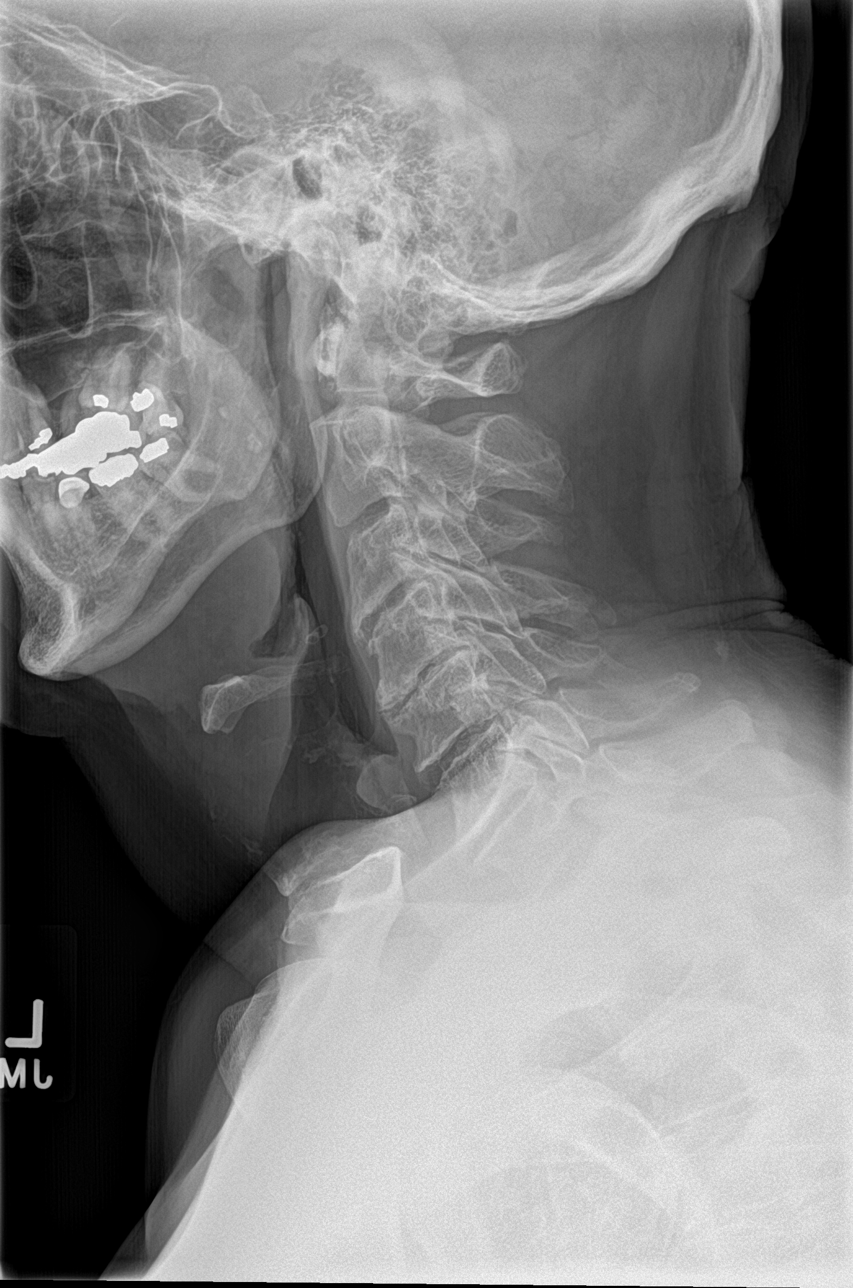

[c-spine ap]
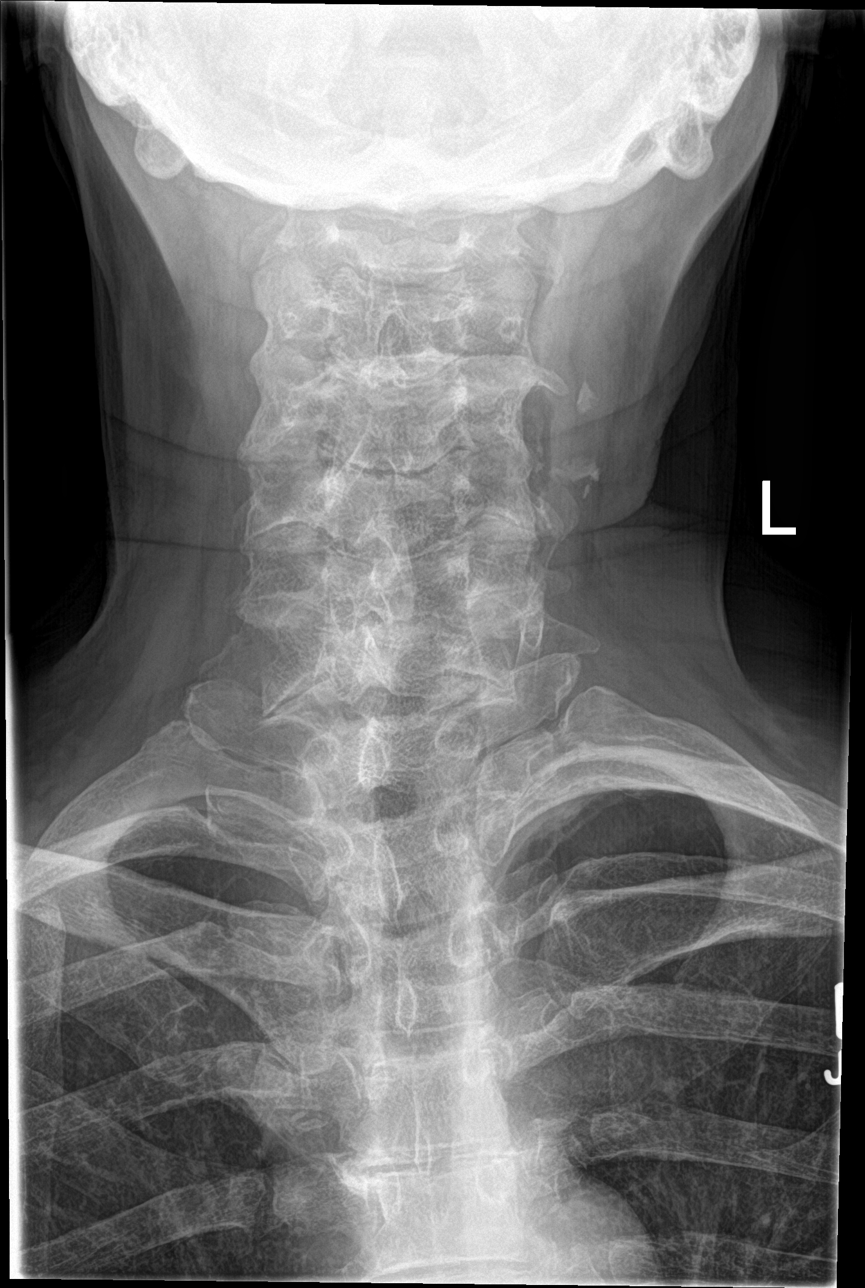

[c-spine open mouth]
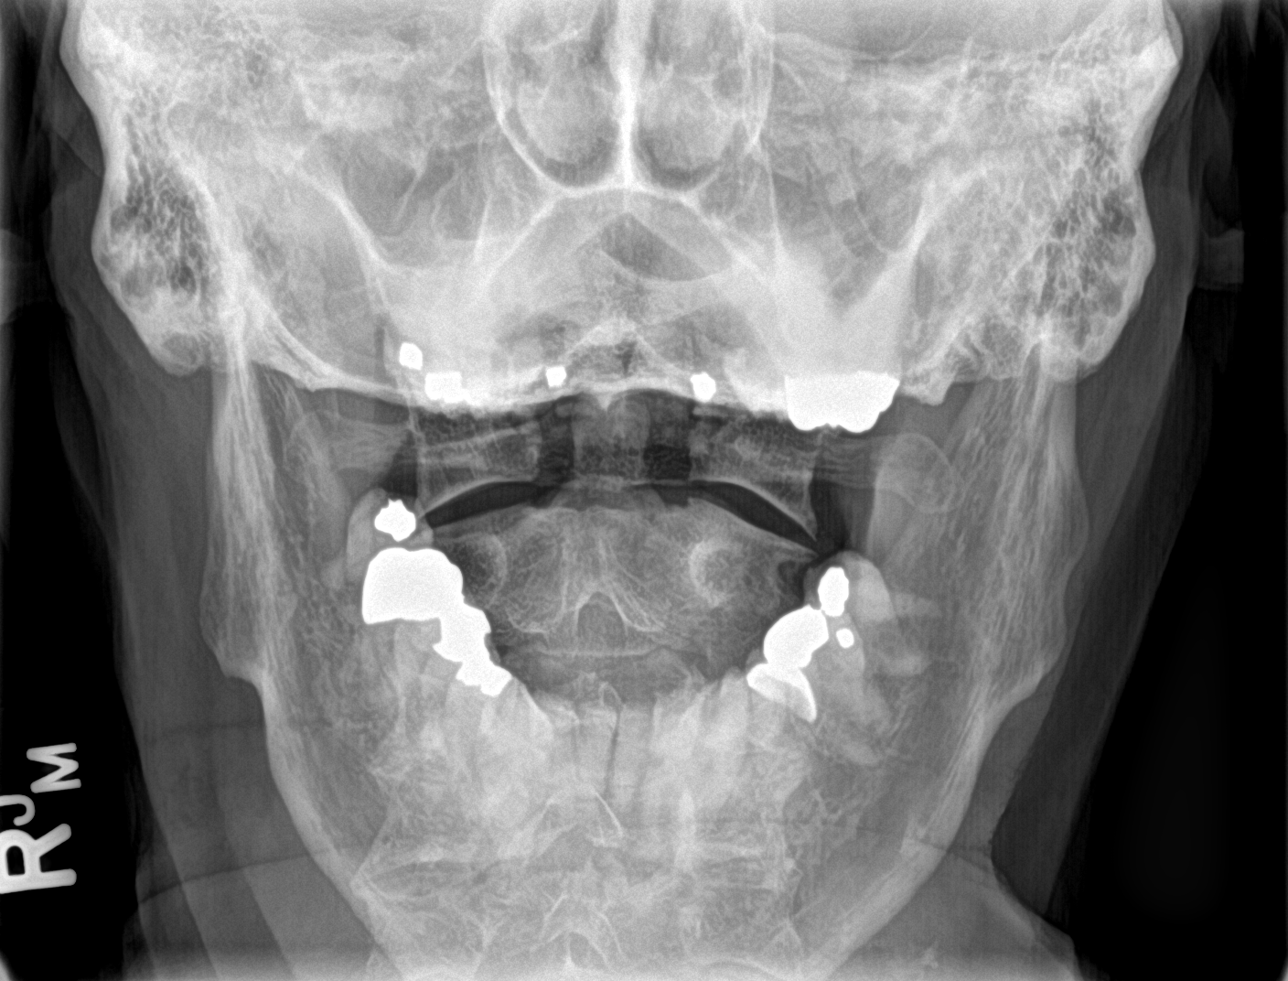

[c-spine swimmers]
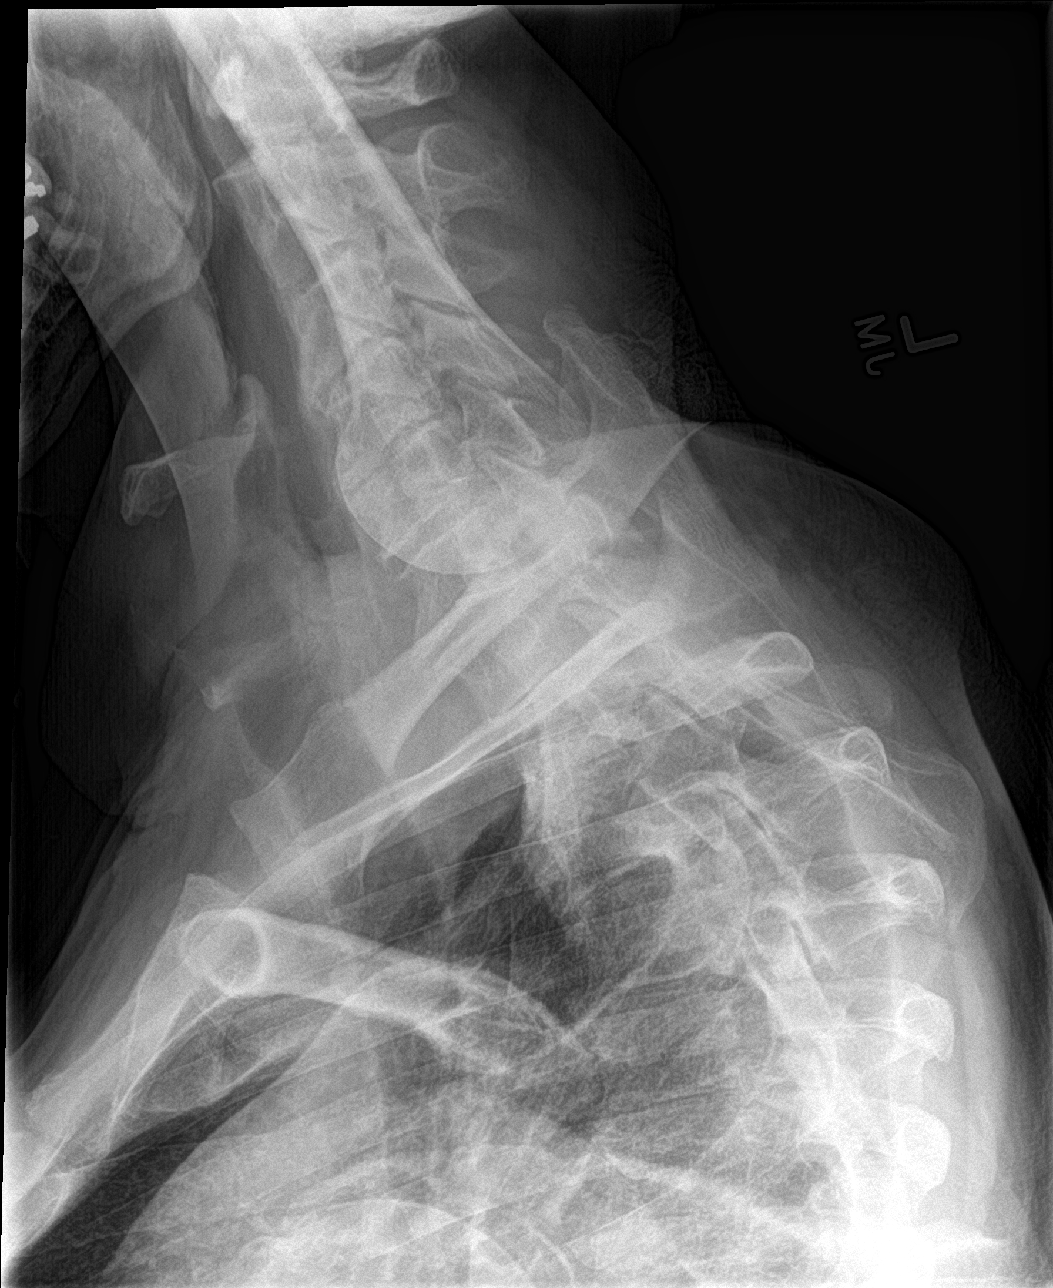

[4 of 4 positions shown; findings below may reference images not displayed]

FINDINGS: The cervical vertebrae are in normal alignment. There is diffuse
degenerative disc disease from C3-C7. There is loss of disc space at
these levels with sclerosis and spurring present. No prevertebral
soft tissue swelling is seen. No fracture is noted. The odontoid
process is intact. The lung apices are clear.
IMPRESSION: Degenerative disc disease from C3-C7.  Normal alignment.

## 2019-10-14 IMAGING — MR MR CERVICAL SPINE W/O CM
4 of 5 series · 26 of 48 positions shown · non-contrast
Comparison: Cervical spine radiographs 01/23/2017.

CLINICAL DATA: 71-year-old male with bilateral cervical neck pain.
Pain radiating to the back of the head for 2 months limited range of
motion of the neck. Dizziness. No known injury. Radiculopathy.

EXAM:
MRI CERVICAL SPINE WITHOUT CONTRAST
TECHNIQUE: Multiplanar, multisequence MR imaging of the cervical spine was
performed. No intravenous contrast was administered.

[Series 2: T2 · sagittal · 3.0mm · 0.41mm/px · 8 of 17 slices shown (1 of 2)]
[im 1/17]
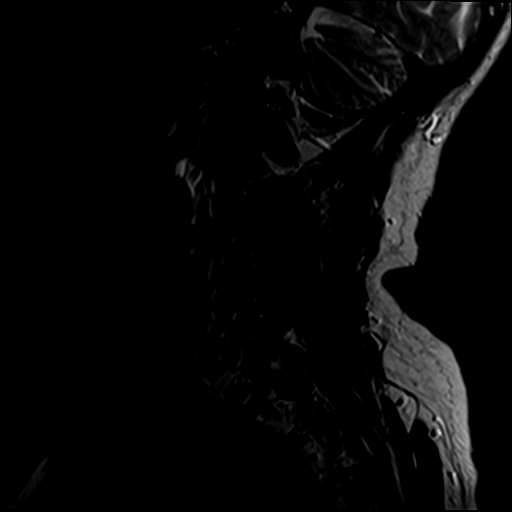
[im 3/17]
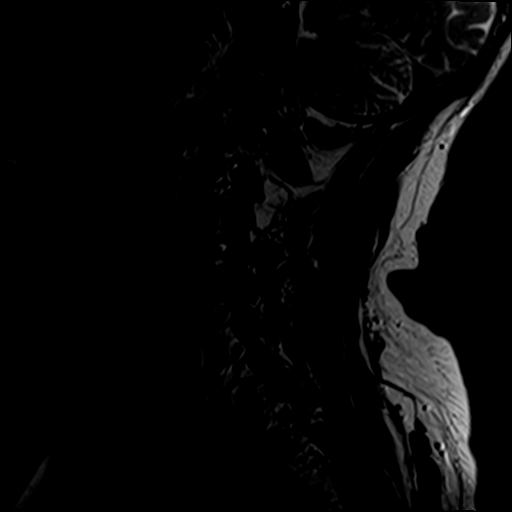
[im 5/17]
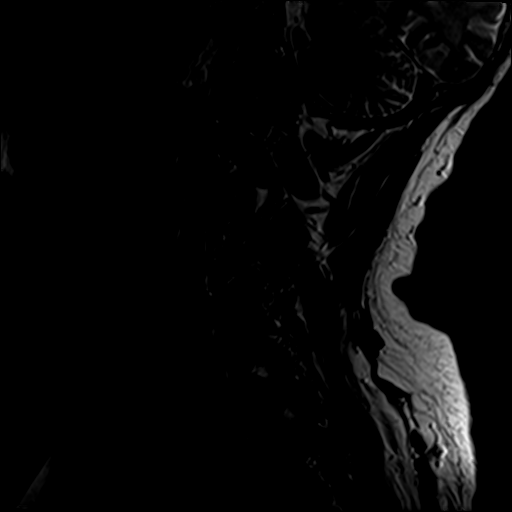
[im 7/17]
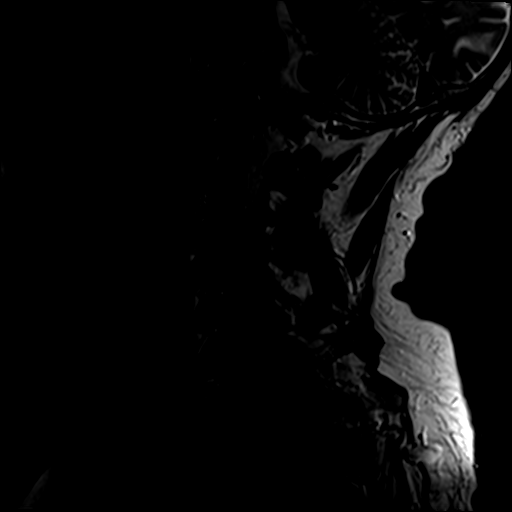
[im 10/17]
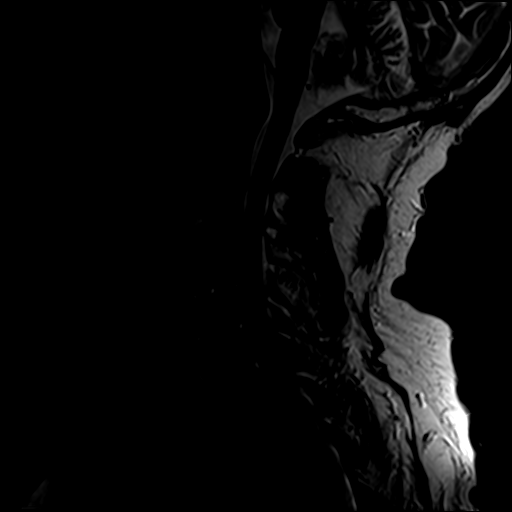
[im 12/17]
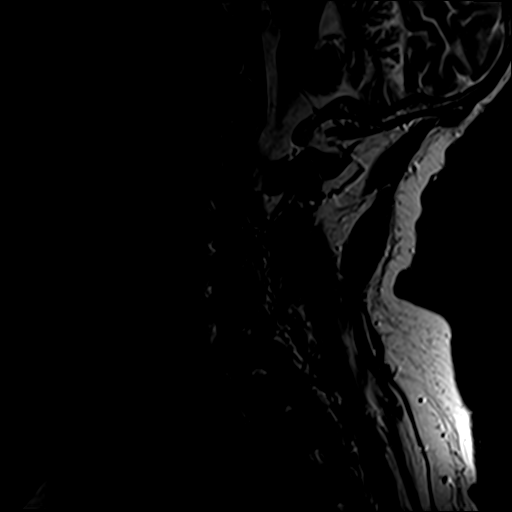
[im 14/17]
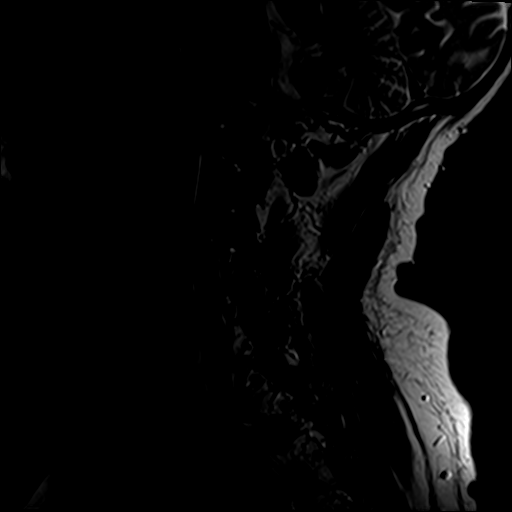
[im 17/17]
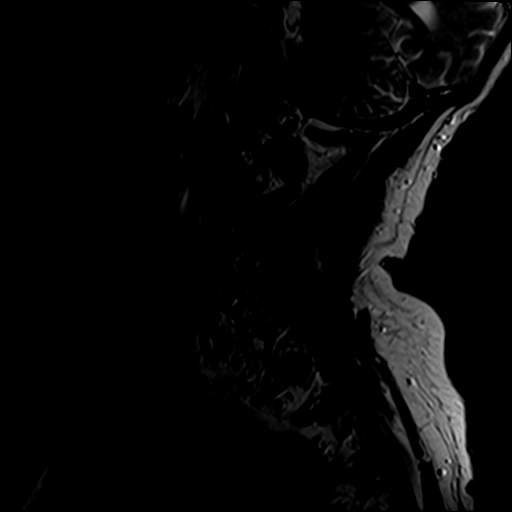

[Series 3: STIR · sagittal · 3.0mm · 0.82mm/px · 3 of 17 slices shown]
[im 3/17]
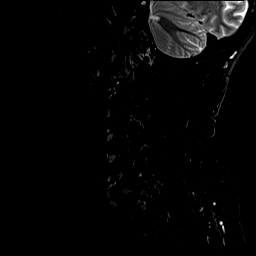
[im 9/17]
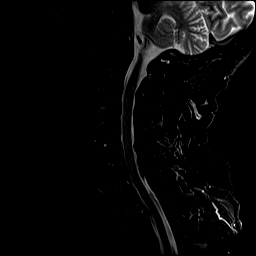
[im 14/17]
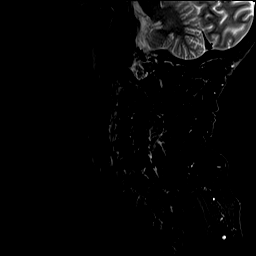

[Series 4: T1 · sagittal · 3.0mm · 0.41mm/px · 6 of 17 slices shown]
[im 1/17]
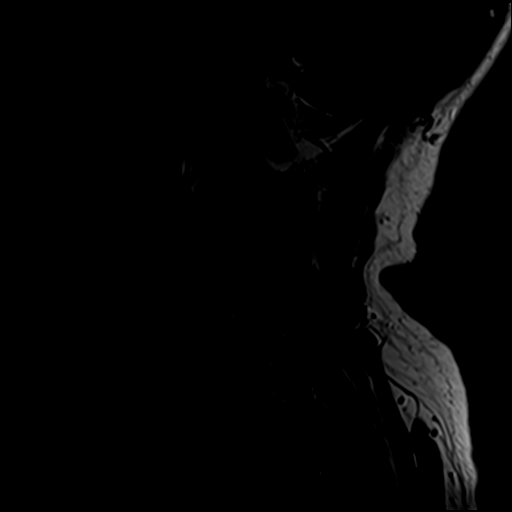
[im 3/17]
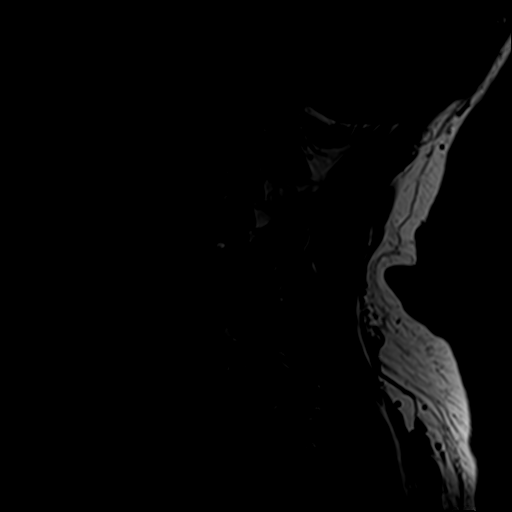
[im 6/17]
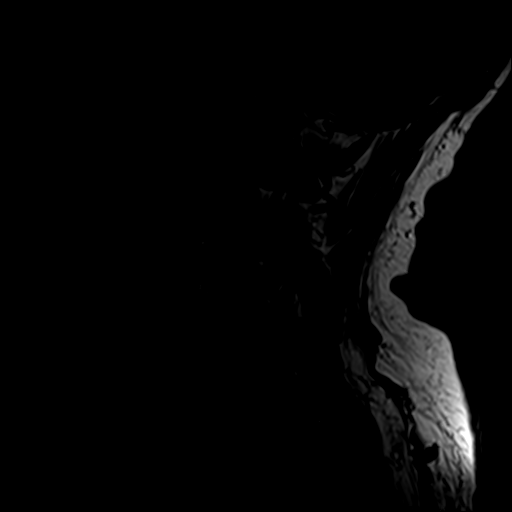
[im 9/17]
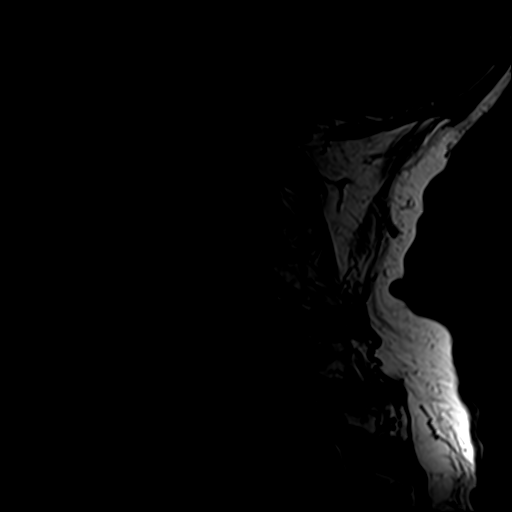
[im 11/17]
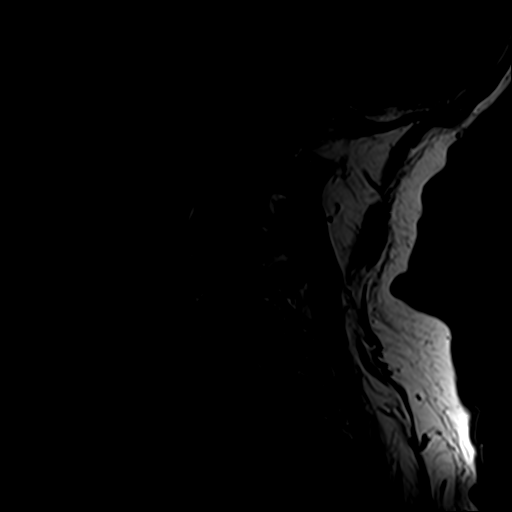
[im 14/17]
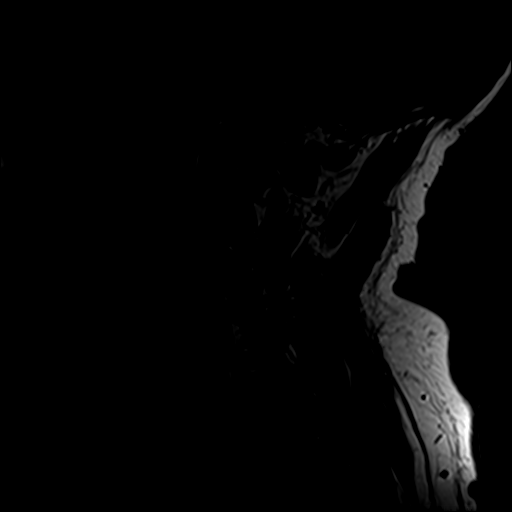

[Series 6: T2 · axial · 3.0mm · 0.70mm/px · z∈[-100,-1]mm · 9 of 29 slices shown (2 of 2)]
[im 1/29]
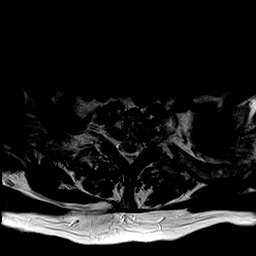
[im 5/29]
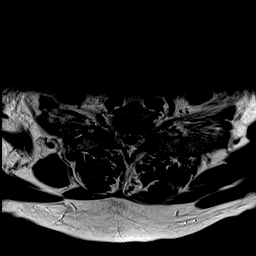
[im 10/29]
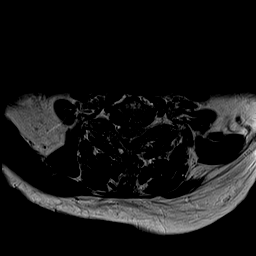
[im 12/29]
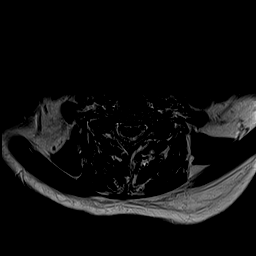
[im 15/29]
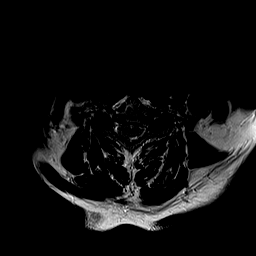
[im 17/29]
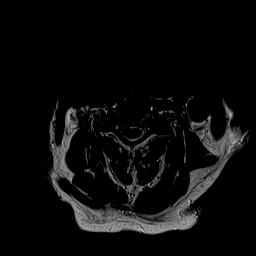
[im 19/29]
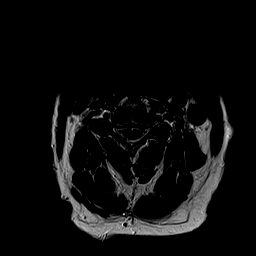
[im 24/29]
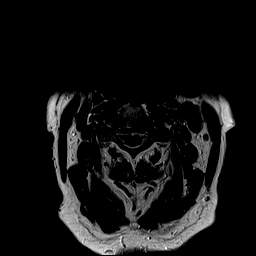
[im 29/29]
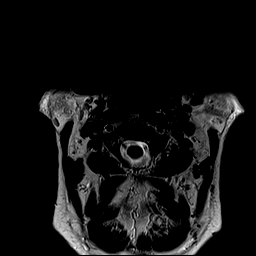

[26 of 48 positions shown; findings below may reference images not displayed]

FINDINGS: Alignment: Mild straightening of cervical lordosis. Alignment
appears stable since [REDACTED]. No spondylolisthesis.

Vertebrae: Visualized bone marrow signal is within normal limits.
Chronic degenerative endplate marrow signal changes. No marrow edema
or evidence of acute osseous abnormality.

Cord: Spinal cord signal is within normal limits at all visualized
levels.

Posterior Fossa, vertebral arteries, paraspinal tissues: Negative
visualized brain parenchyma. Normal visible skull marrow signal.
Suboccipital soft tissues appear normal. Bilateral neck soft tissues
appear normal. Preserved major vascular flow voids.

Disc levels:

C2-C3: Disc space loss with left greater than right foraminal disc
and endplate spurring. Mild facet hypertrophy. No spinal stenosis.
Mild to moderate left and mild right C3 foraminal stenosis.

C3-C4: Disc space loss with circumferential disc osteophyte complex.
Broad-based posterior and right greater than left foraminal
involvement. Mild to moderate facet hypertrophy. Borderline to mild
spinal stenosis. Moderate bilateral C4 foraminal stenosis.

C4-C5: Disc space loss with mild circumferential disc bulge. Mostly
foraminal involvement. Mild to moderate left and moderate right C5
foraminal stenosis.

C5-C6: Disc space loss with circumferential disc osteophyte complex.
Broad-based posterior and foraminal involvement with mild facet
hypertrophy. Mild ligament flavum hypertrophy. Mild spinal stenosis
with no spinal cord mass effect. Severe left and moderate right C6
foraminal stenosis.

C6-C7: Disc space loss with circumferential disc osteophyte complex.
Broad-based posterior and right foraminal component. Mild to
moderate facet and ligament flavum hypertrophy. Spinal stenosis with
no definite spinal cord mass effect. Mild left and moderate right C7
foraminal stenosis.

C7-T1: Mild to moderate facet hypertrophy greater on the left. No
spinal stenosis. Mild to moderate left C8 foraminal stenosis.

Visible upper thoracic spine demonstrates disc space loss with
endplate spurring and facet hypertrophy similar to the cervical
spine. No upper thoracic spinal stenosis.
IMPRESSION: 1. Widespread chronic cervical disc and endplate degeneration with
no No acute osseous abnormality identified. Intermittent posterior
element degeneration, maximal at C5-C6 and C6-C7.
2. Multilevel mild cervical spinal stenosis with no definite spinal
cord mass effect. No cord signal abnormality.
3. Widespread moderate cervical neural foraminal stenosis, and
severe stenosis at the left C6 nerve level.

## 2019-12-10 DIAGNOSIS — L821 Other seborrheic keratosis: Secondary | ICD-10-CM | POA: Diagnosis not present

## 2019-12-10 DIAGNOSIS — Z1283 Encounter for screening for malignant neoplasm of skin: Secondary | ICD-10-CM | POA: Diagnosis not present

## 2019-12-10 DIAGNOSIS — L570 Actinic keratosis: Secondary | ICD-10-CM | POA: Diagnosis not present

## 2019-12-10 DIAGNOSIS — D225 Melanocytic nevi of trunk: Secondary | ICD-10-CM | POA: Diagnosis not present

## 2019-12-10 DIAGNOSIS — X32XXXD Exposure to sunlight, subsequent encounter: Secondary | ICD-10-CM | POA: Diagnosis not present

## 2020-02-02 DIAGNOSIS — Z20822 Contact with and (suspected) exposure to covid-19: Secondary | ICD-10-CM | POA: Diagnosis not present

## 2020-04-14 DIAGNOSIS — D0462 Carcinoma in situ of skin of left upper limb, including shoulder: Secondary | ICD-10-CM | POA: Diagnosis not present

## 2020-05-31 DIAGNOSIS — L570 Actinic keratosis: Secondary | ICD-10-CM | POA: Diagnosis not present

## 2020-05-31 DIAGNOSIS — Z85828 Personal history of other malignant neoplasm of skin: Secondary | ICD-10-CM | POA: Diagnosis not present

## 2020-05-31 DIAGNOSIS — X32XXXD Exposure to sunlight, subsequent encounter: Secondary | ICD-10-CM | POA: Diagnosis not present

## 2020-05-31 DIAGNOSIS — Z08 Encounter for follow-up examination after completed treatment for malignant neoplasm: Secondary | ICD-10-CM | POA: Diagnosis not present

## 2020-06-02 DIAGNOSIS — M7541 Impingement syndrome of right shoulder: Secondary | ICD-10-CM | POA: Diagnosis not present

## 2020-09-27 DIAGNOSIS — H5203 Hypermetropia, bilateral: Secondary | ICD-10-CM | POA: Diagnosis not present

## 2021-01-05 ENCOUNTER — Telehealth (INDEPENDENT_AMBULATORY_CARE_PROVIDER_SITE_OTHER): Payer: Medicare HMO | Admitting: Family

## 2021-01-05 ENCOUNTER — Encounter: Payer: Self-pay | Admitting: Family

## 2021-01-05 ENCOUNTER — Other Ambulatory Visit: Payer: Self-pay

## 2021-01-05 VITALS — BP 130/65 | HR 88 | Temp 98.4°F

## 2021-01-05 DIAGNOSIS — U071 COVID-19: Secondary | ICD-10-CM

## 2021-01-05 MED ORDER — MOLNUPIRAVIR 200 MG PO CAPS: 4.0000 | ORAL_CAPSULE | Freq: Two times a day (BID) | ORAL | 0 refills | Status: AC

## 2021-01-05 MED ORDER — BENZONATATE 100 MG PO CAPS: 100.0000 mg | ORAL_CAPSULE | Freq: Three times a day (TID) | ORAL | 0 refills | Status: DC | PRN

## 2021-01-05 MED ORDER — LIDOCAINE VISCOUS HCL 2 % MT SOLN: 10.0000 mL | OROMUCOSAL | 0 refills | Status: DC | PRN

## 2021-01-05 NOTE — Assessment & Plan Note (Signed)
New.  I am concerned about his hydration. He states he is able to take small sips of liquids and BP looks OK.  Will rx with viscous lidocaine and ibuprofen otc prn throat pain. Recommended small frequent sips of liquids, sorbets etc.  He is advised to go to the ER if increased weakness or if he is unable to drink fluids. For chest congestion recommended mucinex otc. For cough, recommended rx Tessalon 100mg   Advised of CDC guidelines for self isolation/ ending isolation.  Advised of safe practice guidelines. Will rx with molnupiravir (no recent Cr on file for Paxlovid). Symptom Tier reviewed.  Encouraged to monitor for any worsening symptoms; watch for increased shortness of breath, weakness, and signs of dehydration. Advised when to seek emergency care.  Instructed to rest and hydrate well.  Advised to leave the house during recommended isolation period, only if it is necessary to seek medical care.  Patient verbalizes understanding.

## 2021-01-05 NOTE — Progress Notes (Signed)
MyChart Video Visit    Virtual Visit via Video Note   This visit type was conducted due to national recommendations for restrictions regarding the COVID-19 Pandemic (e.g. social distancing) in an effort to limit this patient's exposure and mitigate transmission in our community. This patient is at least at moderate risk for complications without adequate follow up. This format is felt to be most appropriate for this patient at this time. Physical exam was limited by quality of the video and audio technology used for the visit. Rod Holler was able to get the patient set up on a video visit.  Patient location: Home Patient, wife, scribe and provider in visit Provider location: Office  I discussed the limitations of evaluation and management by telemedicine and the availability of in person appointments. The patient expressed understanding and agreed to proceed.  Visit Date: 01/05/2021  Today's healthcare provider: Nance Pear, NP     Subjective:    Patient ID: Sergio Chandler, male    DOB: 1945-05-20, 75 y.o.   MRN: [id number]  Chief Complaint  Patient presents with   Covid Positive    Tested positive 01-04-21   Covid symptoms    Started having symptoms 01-03-21.  Complains of sore throat, cough and weakness.     HPI Patient is in today for a video visit. His Wife is present on today's visit.   Covid-19: He tested positive for Covid-19 yesterday and began having symptoms on Wednesday of this week. His symptoms include a runny nose, sore throat, and general fatigue. He complains that he is not able to drink water or eat food due to how sore his throat is. He also notes a constant cough where phlegm is produced. He denies any fever.  Vitals: He recorded his blood pressure and pulse at home due to concern of dehydration and his blood pressure was 130/65 and his pulse was 88 bpm.  Immunizations: He has had 2 Covid-19 vaccines and an unspecified number of boosters.   Past  Medical History:  Diagnosis Date   Arthritis    History of appendectomy    Hyperlipidemia     Past Surgical History:  Procedure Laterality Date   APPENDECTOMY     HERNIA REPAIR Right     No family history on file.  Social History   Socioeconomic History   Marital status: Married    Spouse name: Not on file   Number of children: Not on file   Years of education: Not on file   Highest education level: Not on file  Occupational History   Not on file  Tobacco Use   Smoking status: Former    Packs/day: 2.00    Years: 28.00    Pack years: 56.00    Types: Cigarettes, Pipe, Cigars    Quit date: 01/24/1979    Years since quitting: 41.9   Smokeless tobacco: Never  Substance and Sexual Activity   Alcohol use: Yes    Comment: 4-6 beers a day.   Drug use: No   Sexual activity: Not on file  Other Topics Concern   Not on file  Social History Narrative   Not on file   Social Determinants of Health   Financial Resource Strain: Not on file  Food Insecurity: Not on file  Transportation Needs: Not on file  Physical Activity: Not on file  Stress: Not on file  Social Connections: Not on file  Intimate Partner Violence: Not on file    Outpatient Medications Prior to  Visit  Medication Sig Dispense Refill   albuterol (PROVENTIL HFA;VENTOLIN HFA) 108 (90 Base) MCG/ACT inhaler Inhale 2 puffs into the lungs every 6 (six) hours as needed for wheezing or shortness of breath. 1 Inhaler 2   atorvastatin (LIPITOR) 20 MG tablet Take 20 mg by mouth daily.     doxycycline (VIBRA-TABS) 100 MG tablet Take 1 twice daily for 10 days  Can give caps or generic 20 tablet 0   predniSONE (DELTASONE) 10 MG tablet 6 TAB PO DAY 1 5 TAB PO DAY 2 4 TAB PO DAY 3 3 TAB PO DAY 4 2 TAB PO DAY 5 1 TAB PO DAY 6 21 tablet 0   benzonatate (TESSALON) 100 MG capsule Take 1 capsule (100 mg total) by mouth 3 (three) times daily as needed for cough. 30 capsule 0   No facility-administered medications prior to  visit.    Not on File  Review of Systems  Constitutional:  Positive for malaise/fatigue (mild fatigue). Negative for fever.  HENT:  Positive for congestion and sore throat.        (+) rhinorrhea   Respiratory:  Positive for cough and sputum production.       Objective:    Physical Exam Constitutional:      General: He is not in acute distress.    Appearance: Normal appearance. He is not ill-appearing.  HENT:     Head: Normocephalic and atraumatic.     Comments: Voice hoarsenss noted    Right Ear: External ear normal.     Left Ear: External ear normal.  Neurological:     Mental Status: He is alert.  Psychiatric:        Behavior: Behavior normal.        Judgment: Judgment normal.    BP 130/65   Pulse 88   Temp 98.4 F (36.9 C)  Wt Readings from Last 3 Encounters:  05/25/18 212 lb 6.4 oz (96.3 kg)  04/10/17 210 lb (95.3 kg)  02/17/17 207 lb (93.9 kg)    Diabetic Foot Exam - Simple   No data filed    Lab Results  Component Value Date   WBC 6.0 08/10/2019   HGB 15.6 08/10/2019   HCT 45 08/10/2019   PLT 237 02/09/2019   GLUCOSE 107 (H) 05/25/2018   CHOL 165 08/10/2019   TRIG 87 08/10/2019   HDL 68 08/10/2019   LDLCALC 80 08/10/2019   ALT 36 02/09/2019   AST 19 02/09/2019   NA 140 02/09/2019   K 4.7 02/09/2019   CL 104 02/09/2019   CREATININE 0.8 08/10/2019   BUN 10 08/10/2019   CO2 30 (A) 02/09/2019   PSA 6.5 08/10/2019    No results found for: TSH Lab Results  Component Value Date   WBC 6.0 08/10/2019   HGB 15.6 08/10/2019   HCT 45 08/10/2019   MCV 88.6 05/25/2018   PLT 237 02/09/2019   Lab Results  Component Value Date   NA 140 02/09/2019   K 4.7 02/09/2019   CO2 30 (A) 02/09/2019   GLUCOSE 107 (H) 05/25/2018   BUN 10 08/10/2019   CREATININE 0.8 08/10/2019   BILITOT 0.9 05/25/2018   ALKPHOS 92 02/09/2019   AST 19 02/09/2019   ALT 36 02/09/2019   PROT 6.2 05/25/2018   ALBUMIN 3.9 02/09/2019   CALCIUM 9.3 02/09/2019   GFR 104.72  05/25/2018   Lab Results  Component Value Date   CHOL 165 08/10/2019   Lab Results  Component Value  Date   HDL 68 08/10/2019   Lab Results  Component Value Date   LDLCALC 80 08/10/2019   Lab Results  Component Value Date   TRIG 87 08/10/2019   Lab Results  Component Value Date   CHOLHDL 3 05/25/2018   No results found for: HGBA1C     Assessment & Plan:   Problem List Items Addressed This Visit       Unprioritized   COVID-19 virus infection - Primary    New.  I am concerned about his hydration. He states he is able to take small sips of liquids and BP looks OK.  Will rx with viscous lidocaine and ibuprofen otc prn throat pain. Recommended small frequent sips of liquids, sorbets etc.  He is advised to go to the ER if increased weakness or if he is unable to drink fluids. For chest congestion recommended mucinex otc. For cough, recommended rx Tessalon 100mg   Advised of CDC guidelines for self isolation/ ending isolation.  Advised of safe practice guidelines. Will rx with molnupiravir (no recent Cr on file for Paxlovid). Symptom Tier reviewed.  Encouraged to monitor for any worsening symptoms; watch for increased shortness of breath, weakness, and signs of dehydration. Advised when to seek emergency care.  Instructed to rest and hydrate well.  Advised to leave the house during recommended isolation period, only if it is necessary to seek medical care.  Patient verbalizes understanding.       Relevant Medications   molnupiravir EUA (LAGEVRIO) 200 MG CAPS capsule   Meds ordered this encounter  Medications   benzonatate (TESSALON) 100 MG capsule    Sig: Take 1 capsule (100 mg total) by mouth 3 (three) times daily as needed for cough.    Dispense:  30 capsule    Refill:  0    Order Specific Question:   Supervising Provider    Answer:   Penni Homans A [[id number]]   lidocaine (XYLOCAINE) 2 % solution    Sig: Use as directed 10 mLs in the mouth or throat every 4 (four) hours as  needed for mouth pain.    Dispense:  200 mL    Refill:  0    Order Specific Question:   Supervising Provider    Answer:   Penni Homans A [[id number]]   molnupiravir EUA (LAGEVRIO) 200 MG CAPS capsule    Sig: Take 4 capsules (800 mg total) by mouth in the morning and at bedtime for 5 days.    Dispense:  40 capsule    Refill:  0    Order Specific Question:   Supervising Provider    Answer:   Penni Homans A [[id number]]    I discussed the assessment and treatment plan with the patient. The patient was provided an opportunity to ask questions and all were answered. The patient agreed with the plan and demonstrated an understanding of the instructions.   The patient was advised to call back or seek an in-person evaluation if the symptoms worsen or if the condition fails to improve as anticipated.  I,Lyric Barr-McArthur,acting as a Education administrator for [employer], NP.,have documented all relevant documentation on the behalf of Nance Pear, NP,as directed by  Nance Pear, NP while in the presence of Nance Pear, NP.  I provided 20 minutes of face-to-face time during this encounter.   Nance Pear, NP [employer] [facility] [employer] [phone] (phone) [phone] (fax)  [place]

## 2021-01-05 NOTE — Patient Instructions (Signed)
Start molnupiravir 4 caps twice daily for 5 days. For chest congestion you may use mucinex 600mg  twice daily. For cough you may use tessalon 3 x daily (rx sent to [facility]). For sore throat you may use ibuprofen 400mg  every 6 hrs as needed as well as viscous lidocaine 1ml Q4 hr prn.   Go to ER if you develop increased weakness or if you become unable to drink liquids.  Call if symptoms do not improve.

## 2021-06-28 ENCOUNTER — Telehealth: Payer: Self-pay | Admitting: Family Medicine

## 2021-06-28 NOTE — Telephone Encounter (Signed)
Spoke with patient to schedule Medicare Annual Wellness Visit (AWV)  ? ?Patient declined stating he will have this done @ [place] in may  ? ?AWVI DUE  04/01/2009 PER [place] ? please schedule at anytime with health coach ? ?This should be a 45 minute visit.  ?

## 2021-07-27 ENCOUNTER — Other Ambulatory Visit: Payer: Self-pay | Admitting: Radiation Oncology

## 2021-07-27 ENCOUNTER — Ambulatory Visit
Admission: RE | Admit: 2021-07-27 | Discharge: 2021-07-27 | Disposition: A | Discharge status: Home / Self Care | Payer: Self-pay | Source: Ambulatory Visit | Attending: Radiation Oncology | Admitting: Radiation Oncology

## 2021-07-27 DIAGNOSIS — C61 Malignant neoplasm of prostate: Secondary | ICD-10-CM

## 2021-07-30 ENCOUNTER — Other Ambulatory Visit: Payer: Self-pay | Admitting: Radiation Oncology

## 2021-08-07 ENCOUNTER — Other Ambulatory Visit: Payer: Self-pay | Admitting: Radiation Oncology

## 2021-08-07 ENCOUNTER — Ambulatory Visit
Admission: RE | Admit: 2021-08-07 | Discharge: 2021-08-07 | Disposition: A | Discharge status: Home / Self Care | Payer: Self-pay | Source: Ambulatory Visit | Attending: Radiation Oncology | Admitting: Radiation Oncology

## 2021-08-07 DIAGNOSIS — C61 Malignant neoplasm of prostate: Secondary | ICD-10-CM

## 2021-08-07 NOTE — Progress Notes (Incomplete)
GU Location of Tumor / Histology: Prostate Ca ? ?If Prostate Cancer, Gleason Score is (5 + 4) and PSA is (8.85 as of 05/2021) ? ?Biopsies: ?Dr. Tretha Sciara [place] ? ? ?Past/Anticipated interventions by urology, if any: {:18581} ? ?Past/Anticipated interventions by medical oncology, if any: {:18581} ? ?Weight changes, if any: {:18581} ? ?IPSS: ?SHIM: ? ?Bowel/Bladder complaints, if any: {:18581}  ? ?Nausea/Vomiting, if any: {:18581} ? ?Pain issues, if any:  {:18581} ? ?SAFETY ISSUES: ?Prior radiation?  ?Pacemaker/ICD?  ?Possible current pregnancy? Male ?Is the patient on methotrexate?  ? ?Current Complaints / other details:   ?

## 2021-08-13 ENCOUNTER — Encounter: Payer: Self-pay | Admitting: Radiation Oncology

## 2021-08-13 ENCOUNTER — Ambulatory Visit
Admission: RE | Admit: 2021-08-13 | Discharge: 2021-08-13 | Disposition: A | Discharge status: Home / Self Care | Payer: No Typology Code available for payment source | Source: Ambulatory Visit | Attending: Radiation Oncology | Admitting: Radiation Oncology

## 2021-08-13 ENCOUNTER — Other Ambulatory Visit: Payer: Self-pay

## 2021-08-13 VITALS — BP 137/75 | HR 68 | Temp 96.5°F | Resp 18 | Ht 72.0 in | Wt 203.0 lb

## 2021-08-13 DIAGNOSIS — Z86010 Personal history of colonic polyps: Secondary | ICD-10-CM | POA: Insufficient documentation

## 2021-08-13 DIAGNOSIS — M2550 Pain in unspecified joint: Secondary | ICD-10-CM | POA: Insufficient documentation

## 2021-08-13 DIAGNOSIS — R972 Elevated prostate specific antigen [PSA]: Secondary | ICD-10-CM | POA: Insufficient documentation

## 2021-08-13 DIAGNOSIS — R69 Illness, unspecified: Secondary | ICD-10-CM | POA: Insufficient documentation

## 2021-08-13 DIAGNOSIS — C61 Malignant neoplasm of prostate: Secondary | ICD-10-CM | POA: Diagnosis present

## 2021-08-13 DIAGNOSIS — M129 Arthropathy, unspecified: Secondary | ICD-10-CM | POA: Insufficient documentation

## 2021-08-13 DIAGNOSIS — K4090 Unilateral inguinal hernia, without obstruction or gangrene, not specified as recurrent: Secondary | ICD-10-CM | POA: Insufficient documentation

## 2021-08-13 DIAGNOSIS — E785 Hyperlipidemia, unspecified: Secondary | ICD-10-CM | POA: Diagnosis not present

## 2021-08-13 DIAGNOSIS — Z7189 Other specified counseling: Secondary | ICD-10-CM | POA: Insufficient documentation

## 2021-08-13 DIAGNOSIS — Z87891 Personal history of nicotine dependence: Secondary | ICD-10-CM | POA: Insufficient documentation

## 2021-08-13 DIAGNOSIS — F1020 Alcohol dependence, uncomplicated: Secondary | ICD-10-CM

## 2021-08-13 DIAGNOSIS — R109 Unspecified abdominal pain: Secondary | ICD-10-CM | POA: Insufficient documentation

## 2021-08-13 HISTORY — DX: Alcohol dependence, uncomplicated: F10.20

## 2021-08-13 NOTE — Progress Notes (Signed)
?Radiation Oncology         [phone] ?________________________________ ? ?Initial Outpatient Consultation ? ?Name: Sergio Chandler MRN: [id number]  ?Date: 08/13/2021  DOB: 1945-12-30 ? ?TH:YHOOILN, Gay Filler, MD  Callie Fielding, MD  ? ?REFERRING PHYSICIAN: Alphia Kava, MD and Lissa Morales, MD ? ?DIAGNOSIS: 76 y.o. gentleman with Stage T1c adenocarcinoma of the prostate with Gleason score of 4+5, and PSA of 8.85. ? ?  ICD-10-CM   ?1. Malignant neoplasm of prostate ([place])  C61   ?  ? ? ?HISTORY OF PRESENT ILLNESS: Sergio Chandler is a 76 y.o. male with a diagnosis of prostate cancer. He was noted to have an elevated PSA of 8.85 in March 2023 .  Accordingly, he was referred for evaluation in urology by Dr. Domenica Fail.  The patient proceeded to transrectal ultrasound with sextant biopsies of the prostate on 04/24/21.  Out of 6 core biopsies, 5 were positive.  The maximum Gleason score was 4+5, and this was seen in the left base. ? ?PSMA PET was negative for metastatic disease ? ?The patient reviewed the biopsy results with his urologist and he has kindly been referred today for discussion of potential radiation treatment options. ? ? ?PREVIOUS RADIATION THERAPY: No ? ?PAST MEDICAL HISTORY:  ?Past Medical History:  ?Diagnosis Date  ? Arthritis   ? History of appendectomy   ? Hyperlipidemia   ? Other and unspecified alcohol dependence, unspecified drinking behavior 08/13/2021  ?   ? ?PAST SURGICAL HISTORY: ?Past Surgical History:  ?Procedure Laterality Date  ? APPENDECTOMY    ? HERNIA REPAIR Right   ? ? ?FAMILY HISTORY: No family history on file. ? ?SOCIAL HISTORY:  ?Social History  ? ?Socioeconomic History  ? Marital status: Married  ?  Spouse name: Not on file  ? Number of children: Not on file  ? Years of education: Not on file  ? Highest education level: Not on file  ?Occupational History  ? Not on file  ?Tobacco Use  ? Smoking status: Former  ?  Packs/day: 2.00  ?  Years: 28.00  ?  Pack years: 56.00  ?  Types:  Cigarettes, Pipe, Cigars  ?  Quit date: 01/24/1979  ?  Years since quitting: 42.5  ? Smokeless tobacco: Never  ?Substance and Sexual Activity  ? Alcohol use: Yes  ?  Comment: 4-6 beers a day.  ? Drug use: No  ? Sexual activity: Not on file  ?Other Topics Concern  ? Not on file  ?Social History Narrative  ? Not on file  ? ?Social Determinants of Health  ? ?Financial Resource Strain: Not on file  ?Food Insecurity: Not on file  ?Transportation Needs: Not on file  ?Physical Activity: Not on file  ?Stress: Not on file  ?Social Connections: Not on file  ?Intimate Partner Violence: Not on file  ? ? ?ALLERGIES: Patient has no allergy information on record. ? ?MEDICATIONS:  ?Current Outpatient Medications  ?Medication Sig Dispense Refill  ? atorvastatin (LIPITOR) 20 MG tablet Take 20 mg by mouth daily.    ? ?No current facility-administered medications for this encounter.  ? ? ?REVIEW OF SYSTEMS:  On review of systems, the patient reports that he is doing well overall. He denies any chest pain, shortness of breath, cough, fevers, chills, night sweats, unintended weight changes. He denies any bowel disturbances, and denies abdominal pain, nausea or vomiting. He denies any new musculoskeletal or joint aches or pains. His IPSS was 12, indicating moderate urinary symptoms (  Reference 0-7 mild, 8-19 moderate, 20-35 severe).  His SHIM was 14, indicating he does have moderate erectile dysfunction (Reference - 22-25 None, 17-21 Mild, 8-16 Moderate, 1-7 Severe). A complete review of systems is obtained and is otherwise negative. ?  ?PHYSICAL EXAM:  ?Wt Readings from Last 3 Encounters:  ?08/13/21 203 lb (92.1 kg)  ?05/25/18 212 lb 6.4 oz (96.3 kg)  ?04/10/17 210 lb (95.3 kg)  ? ?Temp Readings from Last 3 Encounters:  ?08/13/21 (!) 96.5 ?F (35.8 ?C) (Temporal)  ?01/05/21 98.4 ?F (36.9 ?C)  ?05/25/18 98 ?F (36.7 ?C) (Oral)  ? ?BP Readings from Last 3 Encounters:  ?08/13/21 137/75  ?01/05/21 130/65  ?05/25/18 124/75  ? ?Pulse Readings  from Last 3 Encounters:  ?08/13/21 68  ?01/05/21 88  ?05/25/18 75  ? ?Pain Assessment ?Pain Score: 0-No pain/10 ? ?In general this is a well appearing gentleman in no acute distress. He's alert and oriented x4 and appropriate throughout the examination. Cardiopulmonary assessment is negative for acute distress, and he exhibits normal effort.   ? ?KPS = 100 ? ?100 - Normal; no complaints; no evidence of disease. ?90   - Able to carry on normal activity; minor signs or symptoms of disease. ?80   - Normal activity with effort; some signs or symptoms of disease. ?17   - Cares for self; unable to carry on normal activity or to do active work. ?60   - Requires occasional assistance, but is able to care for most of his personal needs. ?50   - Requires considerable assistance and frequent medical care. ?70   - Disabled; requires special care and assistance. ?30   - Severely disabled; hospital admission is indicated although death not imminent. ?20   - Very sick; hospital admission necessary; active supportive treatment necessary. ?10   - Moribund; fatal processes progressing rapidly. ?0     - Dead ? ?Karnofsky DA, Abelmann WH, Craver LS and Burchenal [facility] [phone]) The use of the nitrogen mustards in the palliative treatment of carcinoma: with particular reference to bronchogenic carcinoma Cancer 1 634-56 ? ?LABORATORY DATA:  ?Lab Results  ?Component Value Date  ? WBC 6.0 08/10/2019  ? HGB 15.6 08/10/2019  ? HCT 45 08/10/2019  ? MCV 88.6 05/25/2018  ? PLT 237 02/09/2019  ? ?Lab Results  ?Component Value Date  ? NA 140 02/09/2019  ? K 4.7 02/09/2019  ? CL 104 02/09/2019  ? CO2 30 (A) 02/09/2019  ? ?Lab Results  ?Component Value Date  ? ALT 36 02/09/2019  ? AST 19 02/09/2019  ? ALKPHOS 92 02/09/2019  ? BILITOT 0.9 05/25/2018  ? ?  ?RADIOGRAPHY: No results found. ?   ?IMPRESSION/PLAN: ?1. 76 y.o. gentleman with Stage T1c adenocarcinoma of the prostate with Gleason Score of 4+5, and PSA of 8.85. ?We discussed the patient's workup  and outlined the nature of prostate cancer in this setting. The patient's T stage, Gleason's score, and PSA put him into the High risk group. Accordingly, he is eligible for a variety of potential treatment options including ADT in combination with 5.5-8 weeks of external radiation or prostatectomy. We discussed the available radiation techniques, and focused on the details and logistics of delivery. We discussed and outlined the risks, benefits, short and long-term effects associated with radiotherapy and compared and contrasted these with prostatectomy. We discussed the role of SpaceOAR gel in reducing the rectal toxicity associated with radiotherapy. We also detailed the role of ADT in the treatment of high risk prostate cancer  and outlined the associated side effects that could be expected with this therapy.  He appears to have a good understanding of his disease and our treatment recommendations which are of curative intent.  He was encouraged to ask questions that were answered to his stated satisfaction. ? ?At the conclusion of our conversation, the patient is interested in moving forward with LT-ADT and IMRT, having started ADT on 08/03/21.  He'll return for IMRT simulation on 09/20/21. ? ?We personally spent 60 minutes in this encounter including chart review, reviewing radiological studies, meeting face-to-face with the patient, entering orders and completing documentation. ? ? ? ? ? ?Tyler Pita, MD  ?[facility] ?Direct Dial: [phone]  Fax: [phone] ?[email or web address]  Skype  LinkedIn ? ? ? ?  ? ? ? ?

## 2021-08-13 NOTE — Progress Notes (Addendum)
GU Location of Tumor / Histology: Prostate Ca ? ?If Prostate Cancer, Gleason Score is (5 + 4) and PSA is (8.85 as of 05/2021) ? ?Biopsies: ?Dr. Tretha Sciara [place] ? ? ?Past/Anticipated interventions by urology, if any:  ?07/11/2021 ?PSMA PET ([facility]) with a few tiny areas of tracer activity within the enlarged prostate gland but no evidence of PSMA-avid metastatic disease; also demonstrated incidental probable cystic lesion in the pancreatic tail. ? ? ?Past/Anticipated interventions by medical oncology, if any: NA ? ?Weight changes, if any: No ? ?IPSS:  12 ?SHIM:  14 ? ?Bowel/Bladder complaints, if any:  No ? ?Nausea/Vomiting, if any:  No ? ?Pain issues, if any:  0/10 ? ?SAFETY ISSUES: ?Prior radiation? No ?Pacemaker/ICD? No ?Possible current pregnancy? Male ?Is the patient on methotrexate? No ? ?Current Complaints / other details:  Need more information on treatment options. ?

## 2021-08-13 NOTE — Progress Notes (Signed)
Introduced myself to the patient as the prostate nurse navigator.  No barriers to care identified at this time.  He is here to discuss his radiation treatment options.  I gave him my business card and asked him to call me with questions or concerns.  Verbalized understanding.  ?

## 2021-09-19 ENCOUNTER — Telehealth: Payer: Self-pay | Admitting: *Deleted

## 2021-09-19 NOTE — Telephone Encounter (Signed)
CALLED PATIENT TO REMIND OF SIM APPT. FOR 09-20-21- ARRIVAL TIME- 7:45 AM @ [facility], INFORMED PATIENT TO ARRIVE WITH A FULL BLADDER AND AN EMPTY BOWEL, LVM FOR A RETURN CALL

## 2021-09-20 ENCOUNTER — Other Ambulatory Visit: Payer: Self-pay

## 2021-09-20 ENCOUNTER — Ambulatory Visit
Admission: RE | Admit: 2021-09-20 | Discharge: 2021-09-20 | Disposition: A | Discharge status: Home / Self Care | Payer: No Typology Code available for payment source | Source: Ambulatory Visit | Attending: Radiation Oncology | Admitting: Radiation Oncology

## 2021-09-20 DIAGNOSIS — C61 Malignant neoplasm of prostate: Secondary | ICD-10-CM | POA: Diagnosis not present

## 2021-09-20 NOTE — Progress Notes (Incomplete)
  Radiation Oncology         [phone]) [phone] ________________________________  Name: Sergio Chandler MRN: [id number]  Date: 09/20/2021  DOB: October 23, 1945  SIMULATION AND TREATMENT PLANNING NOTE    ICD-10-CM   1. Malignant neoplasm of prostate ([place])  C61       DIAGNOSIS:  76 y.o. gentleman with Stage T1c adenocarcinoma of the prostate with Gleason score of 4+5, and PSA of 8.85.  NARRATIVE:  The patient was brought to the [facility].  Identity was confirmed.  All relevant records and images related to the planned course of therapy were reviewed.  The patient freely provided informed written consent to proceed with treatment after reviewing the details related to the planned course of therapy. The consent form was witnessed and verified by the simulation staff.  Then, the patient was set-up in a stable reproducible supine position for radiation therapy.  A vacuum lock pillow device was custom fabricated to position his legs in a reproducible immobilized position.  Then, I performed a urethrogram under sterile conditions to identify the prostatic apex.  CT images were obtained.  Surface markings were placed.  The CT images were loaded into the planning software.  Then the prostate target and avoidance structures including the rectum, bladder, bowel and hips were contoured.  Treatment planning then occurred.  The radiation prescription was entered and confirmed.  A total of one complex treatment devices was fabricated. I have requested : Intensity Modulated Radiotherapy (IMRT) is medically necessary for this case for the following reason:  Rectal sparing.Marland Kitchen  PLAN:   The prostate, seminal vesicles, and pelvic lymph nodes will initially be treated to 45 Gy in 25 fractions of 1.8 Gy followed by a boost to the prostate only, to 75 Gy with 15 additional fractions of 2.0 Gy   ________________________________  Sheral Apley Tammi Klippel, M.D.

## 2021-09-24 DIAGNOSIS — C61 Malignant neoplasm of prostate: Secondary | ICD-10-CM | POA: Diagnosis not present

## 2021-10-04 ENCOUNTER — Other Ambulatory Visit: Payer: Self-pay

## 2021-10-04 ENCOUNTER — Ambulatory Visit
Admission: RE | Admit: 2021-10-04 | Discharge: 2021-10-04 | Disposition: A | Discharge status: Home / Self Care | Payer: No Typology Code available for payment source | Source: Ambulatory Visit | Attending: Radiation Oncology | Admitting: Radiation Oncology

## 2021-10-04 DIAGNOSIS — C61 Malignant neoplasm of prostate: Secondary | ICD-10-CM | POA: Insufficient documentation

## 2021-10-04 LAB — RAD ONC ARIA SESSION SUMMARY
Course Elapsed Days: 0
Plan Fractions Treated to Date: 1
Plan Prescribed Dose Per Fraction: 1.8 Gy
Plan Total Fractions Prescribed: 25
Plan Total Prescribed Dose: 45 Gy
Reference Point Dosage Given to Date: 1.8 Gy
Reference Point Session Dosage Given: 1.8 Gy
Session Number: 1

## 2021-10-05 ENCOUNTER — Other Ambulatory Visit: Payer: Self-pay

## 2021-10-05 ENCOUNTER — Ambulatory Visit
Admission: RE | Admit: 2021-10-05 | Discharge: 2021-10-05 | Disposition: A | Discharge status: Home / Self Care | Payer: No Typology Code available for payment source | Source: Ambulatory Visit | Attending: Radiation Oncology | Admitting: Radiation Oncology

## 2021-10-05 ENCOUNTER — Ambulatory Visit: Admission: RE | Admit: 2021-10-05 | Payer: No Typology Code available for payment source | Source: Ambulatory Visit

## 2021-10-05 DIAGNOSIS — C61 Malignant neoplasm of prostate: Secondary | ICD-10-CM | POA: Diagnosis not present

## 2021-10-05 LAB — RAD ONC ARIA SESSION SUMMARY
Course Elapsed Days: 1
Plan Fractions Treated to Date: 2
Plan Prescribed Dose Per Fraction: 1.8 Gy
Plan Total Fractions Prescribed: 25
Plan Total Prescribed Dose: 45 Gy
Reference Point Dosage Given to Date: 3.6 Gy
Reference Point Session Dosage Given: 1.8 Gy
Session Number: 2

## 2021-10-08 ENCOUNTER — Other Ambulatory Visit: Payer: Self-pay

## 2021-10-08 ENCOUNTER — Ambulatory Visit
Admission: RE | Admit: 2021-10-08 | Discharge: 2021-10-08 | Disposition: A | Discharge status: Home / Self Care | Payer: No Typology Code available for payment source | Source: Ambulatory Visit | Attending: Radiation Oncology | Admitting: Radiation Oncology

## 2021-10-08 DIAGNOSIS — C61 Malignant neoplasm of prostate: Secondary | ICD-10-CM | POA: Diagnosis not present

## 2021-10-08 LAB — RAD ONC ARIA SESSION SUMMARY
Course Elapsed Days: 4
Plan Fractions Treated to Date: 3
Plan Prescribed Dose Per Fraction: 1.8 Gy
Plan Total Fractions Prescribed: 25
Plan Total Prescribed Dose: 45 Gy
Reference Point Dosage Given to Date: 5.4 Gy
Reference Point Session Dosage Given: 1.8 Gy
Session Number: 3

## 2021-10-09 ENCOUNTER — Ambulatory Visit
Admission: RE | Admit: 2021-10-09 | Discharge: 2021-10-09 | Disposition: A | Discharge status: Home / Self Care | Payer: No Typology Code available for payment source | Source: Ambulatory Visit | Attending: Radiation Oncology | Admitting: Radiation Oncology

## 2021-10-09 ENCOUNTER — Other Ambulatory Visit: Payer: Self-pay

## 2021-10-09 DIAGNOSIS — C61 Malignant neoplasm of prostate: Secondary | ICD-10-CM | POA: Diagnosis not present

## 2021-10-09 LAB — RAD ONC ARIA SESSION SUMMARY
Course Elapsed Days: 5
Plan Fractions Treated to Date: 4
Plan Prescribed Dose Per Fraction: 1.8 Gy
Plan Total Fractions Prescribed: 25
Plan Total Prescribed Dose: 45 Gy
Reference Point Dosage Given to Date: 7.2 Gy
Reference Point Session Dosage Given: 1.8 Gy
Session Number: 4

## 2021-10-10 ENCOUNTER — Ambulatory Visit
Admission: RE | Admit: 2021-10-10 | Discharge: 2021-10-10 | Disposition: A | Discharge status: Home / Self Care | Payer: No Typology Code available for payment source | Source: Ambulatory Visit | Attending: Radiation Oncology | Admitting: Radiation Oncology

## 2021-10-10 ENCOUNTER — Other Ambulatory Visit: Payer: Self-pay

## 2021-10-10 DIAGNOSIS — C61 Malignant neoplasm of prostate: Secondary | ICD-10-CM | POA: Diagnosis not present

## 2021-10-10 LAB — RAD ONC ARIA SESSION SUMMARY
Course Elapsed Days: 6
Plan Fractions Treated to Date: 5
Plan Prescribed Dose Per Fraction: 1.8 Gy
Plan Total Fractions Prescribed: 25
Plan Total Prescribed Dose: 45 Gy
Reference Point Dosage Given to Date: 9 Gy
Reference Point Session Dosage Given: 1.8 Gy
Session Number: 5

## 2021-10-11 ENCOUNTER — Other Ambulatory Visit: Payer: Self-pay

## 2021-10-11 ENCOUNTER — Ambulatory Visit
Admission: RE | Admit: 2021-10-11 | Discharge: 2021-10-11 | Disposition: A | Discharge status: Home / Self Care | Payer: No Typology Code available for payment source | Source: Ambulatory Visit | Attending: Radiation Oncology | Admitting: Radiation Oncology

## 2021-10-11 DIAGNOSIS — C61 Malignant neoplasm of prostate: Secondary | ICD-10-CM | POA: Diagnosis not present

## 2021-10-11 LAB — RAD ONC ARIA SESSION SUMMARY
Course Elapsed Days: 7
Plan Fractions Treated to Date: 6
Plan Prescribed Dose Per Fraction: 1.8 Gy
Plan Total Fractions Prescribed: 25
Plan Total Prescribed Dose: 45 Gy
Reference Point Dosage Given to Date: 10.8 Gy
Reference Point Session Dosage Given: 1.8 Gy
Session Number: 6

## 2021-10-12 ENCOUNTER — Ambulatory Visit
Admission: RE | Admit: 2021-10-12 | Discharge: 2021-10-12 | Disposition: A | Discharge status: Home / Self Care | Payer: No Typology Code available for payment source | Source: Ambulatory Visit | Attending: Radiation Oncology | Admitting: Radiation Oncology

## 2021-10-12 ENCOUNTER — Other Ambulatory Visit: Payer: Self-pay

## 2021-10-12 DIAGNOSIS — C61 Malignant neoplasm of prostate: Secondary | ICD-10-CM | POA: Diagnosis not present

## 2021-10-12 LAB — RAD ONC ARIA SESSION SUMMARY
Course Elapsed Days: 8
Plan Fractions Treated to Date: 7
Plan Prescribed Dose Per Fraction: 1.8 Gy
Plan Total Fractions Prescribed: 25
Plan Total Prescribed Dose: 45 Gy
Reference Point Dosage Given to Date: 12.6 Gy
Reference Point Session Dosage Given: 1.8 Gy
Session Number: 7

## 2021-10-15 ENCOUNTER — Other Ambulatory Visit: Payer: Self-pay

## 2021-10-15 ENCOUNTER — Ambulatory Visit
Admission: RE | Admit: 2021-10-15 | Discharge: 2021-10-15 | Disposition: A | Discharge status: Home / Self Care | Payer: No Typology Code available for payment source | Source: Ambulatory Visit | Attending: Radiation Oncology | Admitting: Radiation Oncology

## 2021-10-15 DIAGNOSIS — C61 Malignant neoplasm of prostate: Secondary | ICD-10-CM | POA: Diagnosis not present

## 2021-10-15 LAB — RAD ONC ARIA SESSION SUMMARY
Course Elapsed Days: 11
Plan Fractions Treated to Date: 8
Plan Prescribed Dose Per Fraction: 1.8 Gy
Plan Total Fractions Prescribed: 25
Plan Total Prescribed Dose: 45 Gy
Reference Point Dosage Given to Date: 14.4 Gy
Reference Point Session Dosage Given: 1.8 Gy
Session Number: 8

## 2021-10-16 ENCOUNTER — Other Ambulatory Visit: Payer: Self-pay

## 2021-10-16 ENCOUNTER — Ambulatory Visit
Admission: RE | Admit: 2021-10-16 | Discharge: 2021-10-16 | Disposition: A | Discharge status: Home / Self Care | Payer: No Typology Code available for payment source | Source: Ambulatory Visit | Attending: Radiation Oncology | Admitting: Radiation Oncology

## 2021-10-16 DIAGNOSIS — C61 Malignant neoplasm of prostate: Secondary | ICD-10-CM | POA: Diagnosis not present

## 2021-10-16 LAB — RAD ONC ARIA SESSION SUMMARY
Course Elapsed Days: 12
Plan Fractions Treated to Date: 9
Plan Prescribed Dose Per Fraction: 1.8 Gy
Plan Total Fractions Prescribed: 25
Plan Total Prescribed Dose: 45 Gy
Reference Point Dosage Given to Date: 16.2 Gy
Reference Point Session Dosage Given: 1.8 Gy
Session Number: 9

## 2021-10-17 ENCOUNTER — Other Ambulatory Visit: Payer: Self-pay

## 2021-10-17 ENCOUNTER — Ambulatory Visit
Admission: RE | Admit: 2021-10-17 | Discharge: 2021-10-17 | Disposition: A | Discharge status: Home / Self Care | Payer: No Typology Code available for payment source | Source: Ambulatory Visit | Attending: Radiation Oncology | Admitting: Radiation Oncology

## 2021-10-17 DIAGNOSIS — C61 Malignant neoplasm of prostate: Secondary | ICD-10-CM | POA: Diagnosis not present

## 2021-10-17 LAB — RAD ONC ARIA SESSION SUMMARY
Course Elapsed Days: 13
Plan Fractions Treated to Date: 10
Plan Prescribed Dose Per Fraction: 1.8 Gy
Plan Total Fractions Prescribed: 25
Plan Total Prescribed Dose: 45 Gy
Reference Point Dosage Given to Date: 18 Gy
Reference Point Session Dosage Given: 1.8 Gy
Session Number: 10

## 2021-10-18 ENCOUNTER — Other Ambulatory Visit: Payer: Self-pay

## 2021-10-18 ENCOUNTER — Ambulatory Visit
Admission: RE | Admit: 2021-10-18 | Discharge: 2021-10-18 | Disposition: A | Discharge status: Home / Self Care | Payer: No Typology Code available for payment source | Source: Ambulatory Visit | Attending: Radiation Oncology | Admitting: Radiation Oncology

## 2021-10-18 DIAGNOSIS — C61 Malignant neoplasm of prostate: Secondary | ICD-10-CM | POA: Diagnosis not present

## 2021-10-18 LAB — RAD ONC ARIA SESSION SUMMARY
Course Elapsed Days: 14
Plan Fractions Treated to Date: 11
Plan Prescribed Dose Per Fraction: 1.8 Gy
Plan Total Fractions Prescribed: 25
Plan Total Prescribed Dose: 45 Gy
Reference Point Dosage Given to Date: 19.8 Gy
Reference Point Session Dosage Given: 1.8 Gy
Session Number: 11

## 2021-10-19 ENCOUNTER — Other Ambulatory Visit: Payer: Self-pay

## 2021-10-19 ENCOUNTER — Ambulatory Visit
Admission: RE | Admit: 2021-10-19 | Discharge: 2021-10-19 | Disposition: A | Discharge status: Home / Self Care | Payer: No Typology Code available for payment source | Source: Ambulatory Visit | Attending: Radiation Oncology | Admitting: Radiation Oncology

## 2021-10-19 DIAGNOSIS — C61 Malignant neoplasm of prostate: Secondary | ICD-10-CM | POA: Diagnosis not present

## 2021-10-19 LAB — RAD ONC ARIA SESSION SUMMARY
Course Elapsed Days: 15
Plan Fractions Treated to Date: 12
Plan Prescribed Dose Per Fraction: 1.8 Gy
Plan Total Fractions Prescribed: 25
Plan Total Prescribed Dose: 45 Gy
Reference Point Dosage Given to Date: 21.6 Gy
Reference Point Session Dosage Given: 1.8 Gy
Session Number: 12

## 2021-10-22 ENCOUNTER — Other Ambulatory Visit: Payer: Self-pay

## 2021-10-22 ENCOUNTER — Ambulatory Visit
Admission: RE | Admit: 2021-10-22 | Discharge: 2021-10-22 | Disposition: A | Discharge status: Home / Self Care | Payer: No Typology Code available for payment source | Source: Ambulatory Visit | Attending: Radiation Oncology | Admitting: Radiation Oncology

## 2021-10-22 DIAGNOSIS — C61 Malignant neoplasm of prostate: Secondary | ICD-10-CM | POA: Diagnosis not present

## 2021-10-22 LAB — RAD ONC ARIA SESSION SUMMARY
Course Elapsed Days: 18
Plan Fractions Treated to Date: 13
Plan Prescribed Dose Per Fraction: 1.8 Gy
Plan Total Fractions Prescribed: 25
Plan Total Prescribed Dose: 45 Gy
Reference Point Dosage Given to Date: 23.4 Gy
Reference Point Session Dosage Given: 1.8 Gy
Session Number: 13

## 2021-10-23 ENCOUNTER — Ambulatory Visit
Admission: RE | Admit: 2021-10-23 | Discharge: 2021-10-23 | Disposition: A | Discharge status: Home / Self Care | Payer: No Typology Code available for payment source | Source: Ambulatory Visit | Attending: Radiation Oncology | Admitting: Radiation Oncology

## 2021-10-23 ENCOUNTER — Other Ambulatory Visit: Payer: Self-pay

## 2021-10-23 DIAGNOSIS — C61 Malignant neoplasm of prostate: Secondary | ICD-10-CM | POA: Diagnosis not present

## 2021-10-23 LAB — RAD ONC ARIA SESSION SUMMARY
Course Elapsed Days: 19
Plan Fractions Treated to Date: 14
Plan Prescribed Dose Per Fraction: 1.8 Gy
Plan Total Fractions Prescribed: 25
Plan Total Prescribed Dose: 45 Gy
Reference Point Dosage Given to Date: 25.2 Gy
Reference Point Session Dosage Given: 1.8 Gy
Session Number: 14

## 2021-10-24 ENCOUNTER — Ambulatory Visit
Admission: RE | Admit: 2021-10-24 | Discharge: 2021-10-24 | Disposition: A | Discharge status: Home / Self Care | Payer: No Typology Code available for payment source | Source: Ambulatory Visit | Attending: Radiation Oncology | Admitting: Radiation Oncology

## 2021-10-24 ENCOUNTER — Other Ambulatory Visit: Payer: Self-pay

## 2021-10-24 DIAGNOSIS — C61 Malignant neoplasm of prostate: Secondary | ICD-10-CM | POA: Diagnosis not present

## 2021-10-24 LAB — RAD ONC ARIA SESSION SUMMARY
Course Elapsed Days: 20
Plan Fractions Treated to Date: 15
Plan Prescribed Dose Per Fraction: 1.8 Gy
Plan Total Fractions Prescribed: 25
Plan Total Prescribed Dose: 45 Gy
Reference Point Dosage Given to Date: 27 Gy
Reference Point Session Dosage Given: 1.8 Gy
Session Number: 15

## 2021-10-25 ENCOUNTER — Ambulatory Visit: Payer: No Typology Code available for payment source

## 2021-10-26 ENCOUNTER — Ambulatory Visit
Admission: RE | Admit: 2021-10-26 | Discharge: 2021-10-26 | Disposition: A | Discharge status: Home / Self Care | Payer: No Typology Code available for payment source | Source: Ambulatory Visit | Attending: Radiation Oncology | Admitting: Radiation Oncology

## 2021-10-26 ENCOUNTER — Other Ambulatory Visit: Payer: Self-pay

## 2021-10-26 DIAGNOSIS — C61 Malignant neoplasm of prostate: Secondary | ICD-10-CM | POA: Diagnosis not present

## 2021-10-26 LAB — RAD ONC ARIA SESSION SUMMARY
Course Elapsed Days: 22
Plan Fractions Treated to Date: 16
Plan Prescribed Dose Per Fraction: 1.8 Gy
Plan Total Fractions Prescribed: 25
Plan Total Prescribed Dose: 45 Gy
Reference Point Dosage Given to Date: 28.8 Gy
Reference Point Session Dosage Given: 1.8 Gy
Session Number: 16

## 2021-10-29 ENCOUNTER — Ambulatory Visit: Payer: No Typology Code available for payment source

## 2021-10-30 ENCOUNTER — Ambulatory Visit
Admission: RE | Admit: 2021-10-30 | Discharge: 2021-10-30 | Disposition: A | Discharge status: Home / Self Care | Payer: No Typology Code available for payment source | Source: Ambulatory Visit | Attending: Radiation Oncology | Admitting: Radiation Oncology

## 2021-10-30 ENCOUNTER — Other Ambulatory Visit: Payer: Self-pay

## 2021-10-30 DIAGNOSIS — C61 Malignant neoplasm of prostate: Secondary | ICD-10-CM | POA: Insufficient documentation

## 2021-10-30 LAB — RAD ONC ARIA SESSION SUMMARY
Course Elapsed Days: 26
Plan Fractions Treated to Date: 17
Plan Prescribed Dose Per Fraction: 1.8 Gy
Plan Total Fractions Prescribed: 25
Plan Total Prescribed Dose: 45 Gy
Reference Point Dosage Given to Date: 30.6 Gy
Reference Point Session Dosage Given: 1.8 Gy
Session Number: 17

## 2021-10-31 ENCOUNTER — Ambulatory Visit
Admission: RE | Admit: 2021-10-31 | Discharge: 2021-10-31 | Disposition: A | Discharge status: Home / Self Care | Payer: No Typology Code available for payment source | Source: Ambulatory Visit | Attending: Radiation Oncology | Admitting: Radiation Oncology

## 2021-10-31 ENCOUNTER — Other Ambulatory Visit: Payer: Self-pay

## 2021-10-31 DIAGNOSIS — C61 Malignant neoplasm of prostate: Secondary | ICD-10-CM | POA: Diagnosis not present

## 2021-10-31 LAB — RAD ONC ARIA SESSION SUMMARY
Course Elapsed Days: 27
Plan Fractions Treated to Date: 18
Plan Prescribed Dose Per Fraction: 1.8 Gy
Plan Total Fractions Prescribed: 25
Plan Total Prescribed Dose: 45 Gy
Reference Point Dosage Given to Date: 32.4 Gy
Reference Point Session Dosage Given: 1.8 Gy
Session Number: 18

## 2021-11-01 ENCOUNTER — Other Ambulatory Visit: Payer: Self-pay

## 2021-11-01 ENCOUNTER — Ambulatory Visit
Admission: RE | Admit: 2021-11-01 | Discharge: 2021-11-01 | Disposition: A | Discharge status: Home / Self Care | Payer: No Typology Code available for payment source | Source: Ambulatory Visit | Attending: Radiation Oncology | Admitting: Radiation Oncology

## 2021-11-01 DIAGNOSIS — C61 Malignant neoplasm of prostate: Secondary | ICD-10-CM | POA: Diagnosis not present

## 2021-11-01 LAB — RAD ONC ARIA SESSION SUMMARY
Course Elapsed Days: 28
Plan Fractions Treated to Date: 19
Plan Prescribed Dose Per Fraction: 1.8 Gy
Plan Total Fractions Prescribed: 25
Plan Total Prescribed Dose: 45 Gy
Reference Point Dosage Given to Date: 34.2 Gy
Reference Point Session Dosage Given: 1.8 Gy
Session Number: 19

## 2021-11-02 ENCOUNTER — Ambulatory Visit
Admission: RE | Admit: 2021-11-02 | Discharge: 2021-11-02 | Disposition: A | Discharge status: Home / Self Care | Payer: No Typology Code available for payment source | Source: Ambulatory Visit | Attending: Radiation Oncology | Admitting: Radiation Oncology

## 2021-11-02 ENCOUNTER — Other Ambulatory Visit: Payer: Self-pay

## 2021-11-02 DIAGNOSIS — C61 Malignant neoplasm of prostate: Secondary | ICD-10-CM | POA: Diagnosis not present

## 2021-11-02 LAB — RAD ONC ARIA SESSION SUMMARY
Course Elapsed Days: 29
Plan Fractions Treated to Date: 20
Plan Prescribed Dose Per Fraction: 1.8 Gy
Plan Total Fractions Prescribed: 25
Plan Total Prescribed Dose: 45 Gy
Reference Point Dosage Given to Date: 36 Gy
Reference Point Session Dosage Given: 1.8 Gy
Session Number: 20

## 2021-11-05 ENCOUNTER — Ambulatory Visit
Admission: RE | Admit: 2021-11-05 | Discharge: 2021-11-05 | Disposition: A | Discharge status: Home / Self Care | Payer: No Typology Code available for payment source | Source: Ambulatory Visit | Attending: Radiation Oncology | Admitting: Radiation Oncology

## 2021-11-05 ENCOUNTER — Other Ambulatory Visit: Payer: Self-pay

## 2021-11-05 DIAGNOSIS — C61 Malignant neoplasm of prostate: Secondary | ICD-10-CM | POA: Diagnosis not present

## 2021-11-05 LAB — RAD ONC ARIA SESSION SUMMARY
Course Elapsed Days: 32
Plan Fractions Treated to Date: 21
Plan Prescribed Dose Per Fraction: 1.8 Gy
Plan Total Fractions Prescribed: 25
Plan Total Prescribed Dose: 45 Gy
Reference Point Dosage Given to Date: 37.8 Gy
Reference Point Session Dosage Given: 1.8 Gy
Session Number: 21

## 2021-11-06 ENCOUNTER — Ambulatory Visit
Admission: RE | Admit: 2021-11-06 | Discharge: 2021-11-06 | Disposition: A | Discharge status: Home / Self Care | Payer: No Typology Code available for payment source | Source: Ambulatory Visit | Attending: Radiation Oncology | Admitting: Radiation Oncology

## 2021-11-06 ENCOUNTER — Other Ambulatory Visit: Payer: Self-pay

## 2021-11-06 DIAGNOSIS — C61 Malignant neoplasm of prostate: Secondary | ICD-10-CM | POA: Diagnosis not present

## 2021-11-06 LAB — RAD ONC ARIA SESSION SUMMARY
Course Elapsed Days: 33
Plan Fractions Treated to Date: 22
Plan Prescribed Dose Per Fraction: 1.8 Gy
Plan Total Fractions Prescribed: 25
Plan Total Prescribed Dose: 45 Gy
Reference Point Dosage Given to Date: 39.6 Gy
Reference Point Session Dosage Given: 1.8 Gy
Session Number: 22

## 2021-11-07 ENCOUNTER — Other Ambulatory Visit: Payer: Self-pay

## 2021-11-07 ENCOUNTER — Ambulatory Visit
Admission: RE | Admit: 2021-11-07 | Discharge: 2021-11-07 | Disposition: A | Discharge status: Home / Self Care | Payer: No Typology Code available for payment source | Source: Ambulatory Visit | Attending: Radiation Oncology | Admitting: Radiation Oncology

## 2021-11-07 DIAGNOSIS — C61 Malignant neoplasm of prostate: Secondary | ICD-10-CM | POA: Diagnosis not present

## 2021-11-07 LAB — RAD ONC ARIA SESSION SUMMARY
Course Elapsed Days: 34
Plan Fractions Treated to Date: 23
Plan Prescribed Dose Per Fraction: 1.8 Gy
Plan Total Fractions Prescribed: 25
Plan Total Prescribed Dose: 45 Gy
Reference Point Dosage Given to Date: 41.4 Gy
Reference Point Session Dosage Given: 1.8 Gy
Session Number: 23

## 2021-11-08 ENCOUNTER — Other Ambulatory Visit: Payer: Self-pay

## 2021-11-08 ENCOUNTER — Ambulatory Visit
Admission: RE | Admit: 2021-11-08 | Discharge: 2021-11-08 | Disposition: A | Discharge status: Home / Self Care | Payer: No Typology Code available for payment source | Source: Ambulatory Visit | Attending: Radiation Oncology | Admitting: Radiation Oncology

## 2021-11-08 ENCOUNTER — Ambulatory Visit: Payer: No Typology Code available for payment source

## 2021-11-08 DIAGNOSIS — C61 Malignant neoplasm of prostate: Secondary | ICD-10-CM | POA: Diagnosis not present

## 2021-11-08 LAB — RAD ONC ARIA SESSION SUMMARY
Course Elapsed Days: 35
Plan Fractions Treated to Date: 24
Plan Prescribed Dose Per Fraction: 1.8 Gy
Plan Total Fractions Prescribed: 25
Plan Total Prescribed Dose: 45 Gy
Reference Point Dosage Given to Date: 43.2 Gy
Reference Point Session Dosage Given: 1.8 Gy
Session Number: 24

## 2021-11-09 ENCOUNTER — Other Ambulatory Visit: Payer: Self-pay

## 2021-11-09 ENCOUNTER — Ambulatory Visit: Payer: No Typology Code available for payment source

## 2021-11-09 DIAGNOSIS — C61 Malignant neoplasm of prostate: Secondary | ICD-10-CM | POA: Diagnosis not present

## 2021-11-09 LAB — RAD ONC ARIA SESSION SUMMARY
Course Elapsed Days: 36
Plan Fractions Treated to Date: 25
Plan Prescribed Dose Per Fraction: 1.8 Gy
Plan Total Fractions Prescribed: 25
Plan Total Prescribed Dose: 45 Gy
Reference Point Dosage Given to Date: 45 Gy
Reference Point Session Dosage Given: 1.8 Gy
Session Number: 25

## 2021-11-12 ENCOUNTER — Other Ambulatory Visit: Payer: Self-pay

## 2021-11-12 ENCOUNTER — Ambulatory Visit: Payer: No Typology Code available for payment source

## 2021-11-12 DIAGNOSIS — C61 Malignant neoplasm of prostate: Secondary | ICD-10-CM | POA: Diagnosis not present

## 2021-11-12 LAB — RAD ONC ARIA SESSION SUMMARY
Course Elapsed Days: 39
Plan Fractions Treated to Date: 1
Plan Prescribed Dose Per Fraction: 2 Gy
Plan Total Fractions Prescribed: 15
Plan Total Prescribed Dose: 30 Gy
Reference Point Dosage Given to Date: 47 Gy
Reference Point Session Dosage Given: 2 Gy
Session Number: 26

## 2021-11-13 ENCOUNTER — Other Ambulatory Visit: Payer: Self-pay

## 2021-11-13 ENCOUNTER — Ambulatory Visit
Admission: RE | Admit: 2021-11-13 | Discharge: 2021-11-13 | Disposition: A | Discharge status: Home / Self Care | Payer: No Typology Code available for payment source | Source: Ambulatory Visit | Attending: Radiation Oncology | Admitting: Radiation Oncology

## 2021-11-13 DIAGNOSIS — C61 Malignant neoplasm of prostate: Secondary | ICD-10-CM | POA: Diagnosis not present

## 2021-11-13 LAB — RAD ONC ARIA SESSION SUMMARY
Course Elapsed Days: 40
Plan Fractions Treated to Date: 2
Plan Prescribed Dose Per Fraction: 2 Gy
Plan Total Fractions Prescribed: 15
Plan Total Prescribed Dose: 30 Gy
Reference Point Dosage Given to Date: 49 Gy
Reference Point Session Dosage Given: 2 Gy
Session Number: 27

## 2021-11-14 ENCOUNTER — Other Ambulatory Visit: Payer: Self-pay

## 2021-11-14 ENCOUNTER — Ambulatory Visit
Admission: RE | Admit: 2021-11-14 | Discharge: 2021-11-14 | Disposition: A | Discharge status: Home / Self Care | Payer: No Typology Code available for payment source | Source: Ambulatory Visit | Attending: Radiation Oncology | Admitting: Radiation Oncology

## 2021-11-14 DIAGNOSIS — C61 Malignant neoplasm of prostate: Secondary | ICD-10-CM | POA: Diagnosis not present

## 2021-11-14 LAB — RAD ONC ARIA SESSION SUMMARY
Course Elapsed Days: 41
Plan Fractions Treated to Date: 3
Plan Prescribed Dose Per Fraction: 2 Gy
Plan Total Fractions Prescribed: 15
Plan Total Prescribed Dose: 30 Gy
Reference Point Dosage Given to Date: 51 Gy
Reference Point Session Dosage Given: 2 Gy
Session Number: 28

## 2021-11-15 ENCOUNTER — Other Ambulatory Visit: Payer: Self-pay

## 2021-11-15 ENCOUNTER — Ambulatory Visit
Admission: RE | Admit: 2021-11-15 | Discharge: 2021-11-15 | Disposition: A | Discharge status: Home / Self Care | Payer: No Typology Code available for payment source | Source: Ambulatory Visit | Attending: Radiation Oncology | Admitting: Radiation Oncology

## 2021-11-15 DIAGNOSIS — C61 Malignant neoplasm of prostate: Secondary | ICD-10-CM | POA: Diagnosis not present

## 2021-11-15 LAB — RAD ONC ARIA SESSION SUMMARY
Course Elapsed Days: 42
Plan Fractions Treated to Date: 4
Plan Prescribed Dose Per Fraction: 2 Gy
Plan Total Fractions Prescribed: 15
Plan Total Prescribed Dose: 30 Gy
Reference Point Dosage Given to Date: 53 Gy
Reference Point Session Dosage Given: 2 Gy
Session Number: 29

## 2021-11-16 ENCOUNTER — Other Ambulatory Visit: Payer: Self-pay

## 2021-11-16 ENCOUNTER — Ambulatory Visit
Admission: RE | Admit: 2021-11-16 | Discharge: 2021-11-16 | Disposition: A | Discharge status: Home / Self Care | Payer: No Typology Code available for payment source | Source: Ambulatory Visit | Attending: Radiation Oncology | Admitting: Radiation Oncology

## 2021-11-16 DIAGNOSIS — C61 Malignant neoplasm of prostate: Secondary | ICD-10-CM | POA: Diagnosis not present

## 2021-11-16 LAB — RAD ONC ARIA SESSION SUMMARY
Course Elapsed Days: 43
Plan Fractions Treated to Date: 5
Plan Prescribed Dose Per Fraction: 2 Gy
Plan Total Fractions Prescribed: 15
Plan Total Prescribed Dose: 30 Gy
Reference Point Dosage Given to Date: 55 Gy
Reference Point Session Dosage Given: 2 Gy
Session Number: 30

## 2021-11-19 ENCOUNTER — Other Ambulatory Visit: Payer: Self-pay

## 2021-11-19 ENCOUNTER — Ambulatory Visit
Admission: RE | Admit: 2021-11-19 | Discharge: 2021-11-19 | Disposition: A | Discharge status: Home / Self Care | Payer: No Typology Code available for payment source | Source: Ambulatory Visit | Attending: Radiation Oncology | Admitting: Radiation Oncology

## 2021-11-19 DIAGNOSIS — C61 Malignant neoplasm of prostate: Secondary | ICD-10-CM | POA: Diagnosis not present

## 2021-11-19 LAB — RAD ONC ARIA SESSION SUMMARY
Course Elapsed Days: 46
Plan Fractions Treated to Date: 6
Plan Prescribed Dose Per Fraction: 2 Gy
Plan Total Fractions Prescribed: 15
Plan Total Prescribed Dose: 30 Gy
Reference Point Dosage Given to Date: 57 Gy
Reference Point Session Dosage Given: 2 Gy
Session Number: 31

## 2021-11-20 ENCOUNTER — Ambulatory Visit
Admission: RE | Admit: 2021-11-20 | Discharge: 2021-11-20 | Disposition: A | Discharge status: Home / Self Care | Payer: No Typology Code available for payment source | Source: Ambulatory Visit | Attending: Radiation Oncology | Admitting: Radiation Oncology

## 2021-11-20 ENCOUNTER — Other Ambulatory Visit: Payer: Self-pay

## 2021-11-20 DIAGNOSIS — C61 Malignant neoplasm of prostate: Secondary | ICD-10-CM | POA: Diagnosis not present

## 2021-11-20 LAB — RAD ONC ARIA SESSION SUMMARY
Course Elapsed Days: 47
Plan Fractions Treated to Date: 7
Plan Prescribed Dose Per Fraction: 2 Gy
Plan Total Fractions Prescribed: 15
Plan Total Prescribed Dose: 30 Gy
Reference Point Dosage Given to Date: 59 Gy
Reference Point Session Dosage Given: 2 Gy
Session Number: 32

## 2021-11-21 ENCOUNTER — Ambulatory Visit
Admission: RE | Admit: 2021-11-21 | Discharge: 2021-11-21 | Disposition: A | Discharge status: Home / Self Care | Payer: No Typology Code available for payment source | Source: Ambulatory Visit | Attending: Radiation Oncology | Admitting: Radiation Oncology

## 2021-11-21 ENCOUNTER — Other Ambulatory Visit: Payer: Self-pay

## 2021-11-21 DIAGNOSIS — C61 Malignant neoplasm of prostate: Secondary | ICD-10-CM | POA: Diagnosis not present

## 2021-11-21 LAB — RAD ONC ARIA SESSION SUMMARY
Course Elapsed Days: 48
Plan Fractions Treated to Date: 8
Plan Prescribed Dose Per Fraction: 2 Gy
Plan Total Fractions Prescribed: 15
Plan Total Prescribed Dose: 30 Gy
Reference Point Dosage Given to Date: 61 Gy
Reference Point Session Dosage Given: 2 Gy
Session Number: 33

## 2021-11-22 ENCOUNTER — Other Ambulatory Visit: Payer: Self-pay

## 2021-11-22 ENCOUNTER — Ambulatory Visit
Admission: RE | Admit: 2021-11-22 | Discharge: 2021-11-22 | Disposition: A | Discharge status: Home / Self Care | Payer: No Typology Code available for payment source | Source: Ambulatory Visit | Attending: Radiation Oncology | Admitting: Radiation Oncology

## 2021-11-22 DIAGNOSIS — C61 Malignant neoplasm of prostate: Secondary | ICD-10-CM | POA: Diagnosis not present

## 2021-11-22 LAB — RAD ONC ARIA SESSION SUMMARY
Course Elapsed Days: 49
Plan Fractions Treated to Date: 9
Plan Prescribed Dose Per Fraction: 2 Gy
Plan Total Fractions Prescribed: 15
Plan Total Prescribed Dose: 30 Gy
Reference Point Dosage Given to Date: 63 Gy
Reference Point Session Dosage Given: 2 Gy
Session Number: 34

## 2021-11-23 ENCOUNTER — Ambulatory Visit
Admission: RE | Admit: 2021-11-23 | Discharge: 2021-11-23 | Disposition: A | Discharge status: Home / Self Care | Payer: No Typology Code available for payment source | Source: Ambulatory Visit | Attending: Radiation Oncology | Admitting: Radiation Oncology

## 2021-11-23 ENCOUNTER — Other Ambulatory Visit: Payer: Self-pay

## 2021-11-23 DIAGNOSIS — C61 Malignant neoplasm of prostate: Secondary | ICD-10-CM | POA: Diagnosis not present

## 2021-11-23 LAB — RAD ONC ARIA SESSION SUMMARY
Course Elapsed Days: 50
Plan Fractions Treated to Date: 10
Plan Prescribed Dose Per Fraction: 2 Gy
Plan Total Fractions Prescribed: 15
Plan Total Prescribed Dose: 30 Gy
Reference Point Dosage Given to Date: 65 Gy
Reference Point Session Dosage Given: 2 Gy
Session Number: 35

## 2021-11-26 ENCOUNTER — Other Ambulatory Visit: Payer: Self-pay

## 2021-11-26 ENCOUNTER — Ambulatory Visit
Admission: RE | Admit: 2021-11-26 | Discharge: 2021-11-26 | Disposition: A | Discharge status: Home / Self Care | Payer: No Typology Code available for payment source | Source: Ambulatory Visit | Attending: Radiation Oncology | Admitting: Radiation Oncology

## 2021-11-26 DIAGNOSIS — C61 Malignant neoplasm of prostate: Secondary | ICD-10-CM | POA: Diagnosis not present

## 2021-11-26 LAB — RAD ONC ARIA SESSION SUMMARY
Course Elapsed Days: 53
Plan Fractions Treated to Date: 11
Plan Prescribed Dose Per Fraction: 2 Gy
Plan Total Fractions Prescribed: 15
Plan Total Prescribed Dose: 30 Gy
Reference Point Dosage Given to Date: 67 Gy
Reference Point Session Dosage Given: 2 Gy
Session Number: 36

## 2021-11-27 ENCOUNTER — Ambulatory Visit
Admission: RE | Admit: 2021-11-27 | Discharge: 2021-11-27 | Disposition: A | Discharge status: Home / Self Care | Payer: No Typology Code available for payment source | Source: Ambulatory Visit | Attending: Radiation Oncology | Admitting: Radiation Oncology

## 2021-11-27 ENCOUNTER — Other Ambulatory Visit: Payer: Self-pay

## 2021-11-27 DIAGNOSIS — C61 Malignant neoplasm of prostate: Secondary | ICD-10-CM | POA: Diagnosis not present

## 2021-11-27 LAB — RAD ONC ARIA SESSION SUMMARY
Course Elapsed Days: 54
Plan Fractions Treated to Date: 12
Plan Prescribed Dose Per Fraction: 2 Gy
Plan Total Fractions Prescribed: 15
Plan Total Prescribed Dose: 30 Gy
Reference Point Dosage Given to Date: 69 Gy
Reference Point Session Dosage Given: 2 Gy
Session Number: 37

## 2021-11-28 ENCOUNTER — Ambulatory Visit
Admission: RE | Admit: 2021-11-28 | Discharge: 2021-11-28 | Disposition: A | Discharge status: Home / Self Care | Payer: No Typology Code available for payment source | Source: Ambulatory Visit | Attending: Radiation Oncology | Admitting: Radiation Oncology

## 2021-11-28 ENCOUNTER — Ambulatory Visit: Payer: No Typology Code available for payment source

## 2021-11-28 ENCOUNTER — Other Ambulatory Visit: Payer: Self-pay

## 2021-11-28 DIAGNOSIS — C61 Malignant neoplasm of prostate: Secondary | ICD-10-CM | POA: Diagnosis not present

## 2021-11-28 LAB — RAD ONC ARIA SESSION SUMMARY
Course Elapsed Days: 55
Plan Fractions Treated to Date: 13
Plan Prescribed Dose Per Fraction: 2 Gy
Plan Total Fractions Prescribed: 15
Plan Total Prescribed Dose: 30 Gy
Reference Point Dosage Given to Date: 71 Gy
Reference Point Session Dosage Given: 2 Gy
Session Number: 38

## 2021-11-29 ENCOUNTER — Other Ambulatory Visit: Payer: Self-pay

## 2021-11-29 ENCOUNTER — Ambulatory Visit: Payer: No Typology Code available for payment source

## 2021-11-29 ENCOUNTER — Ambulatory Visit
Admission: RE | Admit: 2021-11-29 | Discharge: 2021-11-29 | Disposition: A | Discharge status: Home / Self Care | Payer: No Typology Code available for payment source | Source: Ambulatory Visit | Attending: Radiation Oncology | Admitting: Radiation Oncology

## 2021-11-29 DIAGNOSIS — C61 Malignant neoplasm of prostate: Secondary | ICD-10-CM | POA: Diagnosis not present

## 2021-11-29 LAB — RAD ONC ARIA SESSION SUMMARY
Course Elapsed Days: 56
Plan Fractions Treated to Date: 14
Plan Prescribed Dose Per Fraction: 2 Gy
Plan Total Fractions Prescribed: 15
Plan Total Prescribed Dose: 30 Gy
Reference Point Dosage Given to Date: 73 Gy
Reference Point Session Dosage Given: 2 Gy
Session Number: 39

## 2021-11-30 ENCOUNTER — Other Ambulatory Visit: Payer: Self-pay

## 2021-11-30 ENCOUNTER — Ambulatory Visit
Admission: RE | Admit: 2021-11-30 | Discharge: 2021-11-30 | Disposition: A | Discharge status: Home / Self Care | Payer: No Typology Code available for payment source | Source: Ambulatory Visit | Attending: Radiation Oncology | Admitting: Radiation Oncology

## 2021-11-30 ENCOUNTER — Encounter: Payer: Self-pay | Admitting: Urology

## 2021-11-30 DIAGNOSIS — C61 Malignant neoplasm of prostate: Secondary | ICD-10-CM | POA: Diagnosis present

## 2021-11-30 LAB — RAD ONC ARIA SESSION SUMMARY
Course Elapsed Days: 57
Plan Fractions Treated to Date: 15
Plan Prescribed Dose Per Fraction: 2 Gy
Plan Total Fractions Prescribed: 15
Plan Total Prescribed Dose: 30 Gy
Reference Point Dosage Given to Date: 75 Gy
Reference Point Session Dosage Given: 2 Gy
Session Number: 40

## 2021-12-12 DIAGNOSIS — H5203 Hypermetropia, bilateral: Secondary | ICD-10-CM | POA: Diagnosis not present

## 2022-01-22 NOTE — Progress Notes (Signed)
  Radiation Oncology         [phone] ________________________________  Name: Sergio Chandler MRN: [id number]  Date of Service: 01/23/2022  DOB: [birth year]  Post Treatment Telephone Note  Diagnosis:  76 y.o. gentleman with Stage T1c adenocarcinoma of the prostate with Gleason score of 4+5, and PSA of 8.85. (as documented in provider EOT note)  The patient was not available for call today. A voicemail was left.   Patient was advised to call to schedule their post-treatment follow up with his urologist for ongoing surveillance. He was counseled that PSA levels will be drawn in his urologist office, and was reassured that additional time is expected to improve bowel and bladder symptoms. He was encouraged to call back with concerns or questions regarding radiation.    Leandra Kern, LPN

## 2022-01-23 ENCOUNTER — Other Ambulatory Visit: Payer: Self-pay | Admitting: Urology

## 2022-01-23 ENCOUNTER — Ambulatory Visit
Admission: RE | Admit: 2022-01-23 | Discharge: 2022-01-23 | Disposition: A | Discharge status: Home / Self Care | Payer: No Typology Code available for payment source | Source: Ambulatory Visit | Attending: Urology | Admitting: Urology

## 2022-01-23 DIAGNOSIS — C61 Malignant neoplasm of prostate: Secondary | ICD-10-CM

## 2022-01-23 NOTE — Progress Notes (Signed)
  Radiation Oncology         [phone] ________________________________  Name: Sergio Chandler MRN: [id number]  Date: 11/30/2021  DOB: 1945-07-02  End of Treatment Note  Diagnosis:   76 y.o. gentleman with Stage T1c adenocarcinoma of the prostate with Gleason score of 4+5, and PSA of 8.85.      Indication for treatment:  Curative, Definitive Radiotherapy       Radiation treatment dates:   10/04/21 - 11/30/21  Site/dose:  1. The prostate, seminal vesicles, and pelvic lymph nodes were initially treated to 45 Gy in 25 fractions of 1.8 Gy  2. The prostate only was boosted to 75 Gy with 15 additional fractions of 2.0 Gy   Beams/energy:  1. The prostate, seminal vesicles, and pelvic lymph nodes were initially treated using VMAT intensity modulated radiotherapy delivering 6 megavolt photons. Image guidance was performed with CB-CT studies prior to each fraction. He was immobilized with a body fix lower extremity mold.  2. the prostate only was boosted using VMAT intensity modulated radiotherapy delivering 6 megavolt photons. Image guidance was performed with CB-CT studies prior to each fraction. He was immobilized with a body fix lower extremity mold.  Narrative: The patient tolerated radiation treatment relatively well with only minor urinary irritation and modest fatigue.  He did report increased nocturia 4-5 times per night as well as some loose stool/diarrhea that was relieved with Imodium as needed.  Plan: The patient has completed radiation treatment. He will return to radiation oncology clinic for routine followup in one month. I advised him to call or return sooner if he has any questions or concerns related to his recovery or treatment. ________________________________  Sheral Apley. Tammi Klippel, M.D.

## 2022-01-25 ENCOUNTER — Encounter: Payer: Self-pay | Admitting: *Deleted

## 2022-01-25 NOTE — Progress Notes (Signed)
Received request from Sergio Caldron, PA to ensure patient is scheduled for follow up with his team at [facility].  I called and spoke to Sergio Chandler and informed her that Sergio Chandler has completed his radiation treatments and is ready to be scheduled for follow up.  Sergio Chandler repeated the information and will notify Sergio Chandler in nursing who coordinates follow up.  I gave her my contact information if needed for questions.

## 2022-04-11 ENCOUNTER — Encounter: Payer: Self-pay | Admitting: *Deleted

## 2022-06-14 DIAGNOSIS — H43813 Vitreous degeneration, bilateral: Secondary | ICD-10-CM | POA: Diagnosis not present

## 2022-06-14 DIAGNOSIS — H35372 Puckering of macula, left eye: Secondary | ICD-10-CM | POA: Diagnosis not present

## 2022-06-14 DIAGNOSIS — Z961 Presence of intraocular lens: Secondary | ICD-10-CM | POA: Diagnosis not present

## 2022-08-17 DIAGNOSIS — H5203 Hypermetropia, bilateral: Secondary | ICD-10-CM | POA: Diagnosis not present

## 2022-08-17 DIAGNOSIS — H52209 Unspecified astigmatism, unspecified eye: Secondary | ICD-10-CM | POA: Diagnosis not present

## 2022-12-02 ENCOUNTER — Ambulatory Visit
Admission: EM | Admit: 2022-12-02 | Discharge: 2022-12-02 | Disposition: A | Discharge status: Home / Self Care | Payer: Medicare HMO | Attending: Internal Medicine | Admitting: Internal Medicine

## 2022-12-02 DIAGNOSIS — L089 Local infection of the skin and subcutaneous tissue, unspecified: Secondary | ICD-10-CM

## 2022-12-02 MED ORDER — CEPHALEXIN 500 MG PO CAPS: 500.0000 mg | ORAL_CAPSULE | Freq: Four times a day (QID) | ORAL | 0 refills | Status: AC

## 2022-12-02 NOTE — Discharge Instructions (Signed)
Start Keflex 4 times a day for 1 week.  Warm compresses to the area and the knee as needed.  Please follow-up with your PCP in 2 days for recheck.  Please go to the ER for any worsening symptoms.  I hope you feel better soon!

## 2022-12-02 NOTE — ED Provider Notes (Signed)
[facility]    CSN: [id number] Arrival date & time: 12/02/22  2536      History   Chief Complaint No chief complaint on file.   HPI Sergio Chandler is a 77 y.o. male presents for knee concern.  Patient reports a month of a swollen area to his anterior right knee.  States he thinks he got a splinter to the area prior to it starting.  States the swelling comes and goes.  He tried to squeeze it and remove the splinter himself without success.  No fevers or chills.  No history of MRSA.  He has not used any OTC medications since symptoms began.  No other concerns at this time  HPI  Past Medical History:  Diagnosis Date   Arthritis    History of appendectomy    Hyperlipidemia    Other and unspecified alcohol dependence, unspecified drinking behavior 08/13/2021    Patient Active Problem List   Diagnosis Date Noted   Malignant neoplasm of prostate (HCC) 08/13/2021   Pain in joint 08/13/2021   Other specified counseling 08/13/2021   Other ill-defined and unknown causes of morbidity and mortality 08/13/2021   Other and unspecified hyperlipidemia 08/13/2021   Other and unspecified alcohol dependence, unspecified drinking behavior 08/13/2021   Inguinal hernia 08/13/2021   History of colonic polyps 08/13/2021   Elevated PSA 08/13/2021   Abdominal pain 08/13/2021   COVID-19 virus infection 01/05/2021   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 Authorizing Provider  cephALEXin (KEFLEX) 500 MG capsule Take 1 capsule (500 mg total) by mouth 4 (four) times daily for 7 days. 12/02/22 12/09/22 Yes Radford Pax, NP  atorvastatin (LIPITOR) 20 MG tablet Take 20 mg by mouth daily.    [provider]    Family History History reviewed. No pertinent family history.  Social  History Social History   Tobacco Use   Smoking status: Former    Current packs/day: 0.00    Average packs/day: 2.0 packs/day for 28.0 years (56.0 ttl pk-yrs)    Types: Cigarettes, Pipe, Cigars    Start date: 01/24/1951    Quit date: 01/24/1979    Years since quitting: 43.8   Smokeless tobacco: Never  Substance Use Topics   Alcohol use: Yes    Comment: 4-6 beers a day.   Drug use: No     Allergies   Patient has no known allergies.   Review of Systems Review of Systems  Skin:  Positive for wound.     Physical Exam Triage Vital Signs ED Triage Vitals [12/02/22 0900]  Encounter Vitals Group     BP 124/74     Systolic BP Percentile      Diastolic BP Percentile      Pulse Rate 80     Resp 16     Temp 98.8 F (37.1 C)     Temp Source Oral     SpO2 96 %     Weight      Height      Head Circumference      Peak Flow      Pain Score 3     Pain Loc      Pain Education      Exclude from Growth Chart  No data found.  Updated Vital Signs BP 124/74 (BP Location: Left Arm)   Pulse 80   Temp 98.8 F (37.1 C) (Oral)   Resp 16   SpO2 96%   Visual Acuity Right Eye Distance:   Left Eye Distance:   Bilateral Distance:    Right Eye Near:   Left Eye Near:    Bilateral Near:     Physical Exam Vitals and nursing note reviewed.  Constitutional:      General: He is not in acute distress.    Appearance: Normal appearance. He is not ill-appearing.  HENT:     Head: Normocephalic and atraumatic.  Eyes:     Pupils: Pupils are equal, round, and reactive to light.  Cardiovascular:     Rate and Rhythm: Normal rate.  Pulmonary:     Effort: Pulmonary effort is normal.  Skin:    General: Skin is warm and dry.          Comments: 1.5cm nonfluctuant area of mild erythema and swelling.  No visible or palpable foreign body.  Full range of motion of knee without restriction.  Area is tender to palpation.  No knee swelling.  Neurological:     General: No focal deficit  present.     Mental Status: He is alert and oriented to person, place, and time.  Psychiatric:        Mood and Affect: Mood normal.        Behavior: Behavior normal.      UC Treatments / Results  Labs (all labs ordered are listed, but only abnormal results are displayed) Labs Reviewed - No data to display  EKG   Radiology No results found.  Procedures Procedures (including critical care time)  Medications Ordered in UC Medications - No data to display  Initial Impression / Assessment and Plan / UC Course  I have reviewed the triage vital signs and the nursing notes.  Pertinent labs & imaging results that were available during my care of the patient were reviewed by me and considered in my medical decision making (see chart for details).     Reviewed exam and symptoms with patient.  No red flags.  No palpable or visible foreign body.  Symptoms onset 1 month ago.  Will treat for infection with Keflex for 7 days.  Advised to see PCP in 2 days for recheck.  ER precautions reviewed and patient verbalized understanding. Final Clinical Impressions(s) / UC Diagnoses   Final diagnoses:  Infection of skin of knee     Discharge Instructions      Start Keflex 4 times a day for 1 week.  Warm compresses to the area and the knee as needed.  Please follow-up with your PCP in 2 days for recheck.  Please go to the ER for any worsening symptoms.  I hope you feel better soon!    ED Prescriptions     Medication Sig Dispense Auth. Provider   cephALEXin (KEFLEX) 500 MG capsule Take 1 capsule (500 mg total) by mouth 4 (four) times daily for 7 days. 28 capsule Radford Pax, NP      PDMP not reviewed this encounter.   Radford Pax, NP 12/02/22 [phone]

## 2022-12-02 NOTE — ED Triage Notes (Signed)
Pt presents to UC w/ c/o raised area on right knee causing pain x1 month No drainage

## 2022-12-05 ENCOUNTER — Ambulatory Visit (INDEPENDENT_AMBULATORY_CARE_PROVIDER_SITE_OTHER): Payer: Medicare HMO | Admitting: Family Medicine

## 2022-12-05 ENCOUNTER — Encounter: Payer: Self-pay | Admitting: Family Medicine

## 2022-12-05 VITALS — BP 111/55 | HR 78 | Resp 18 | Ht 72.0 in | Wt 201.2 lb

## 2022-12-05 DIAGNOSIS — M25561 Pain in right knee: Secondary | ICD-10-CM

## 2022-12-05 DIAGNOSIS — M25461 Effusion, right knee: Secondary | ICD-10-CM | POA: Diagnosis not present

## 2022-12-05 NOTE — Progress Notes (Signed)
   Acute Office Visit  Subjective:     Patient ID: Sergio Chandler, male    DOB: 07-26-1945, 77 y.o.   MRN: [id number]  Chief Complaint  Patient presents with   Follow-up    Onset "about a month ago" Pt states his knee still hurts, he isn't able ti tell much of a difference     HPI Patient is in today for  Urgent Care follow-up.   Discussed the use of AI scribe software for clinical note transcription with the patient, who gave verbal consent to proceed.  History of Present Illness   The patient presents with a swollen, warm, and painful knee. The patient is unsure of the onset or cause of the current episode, but guessing about a month. The patient denies any fever or drainage from the knee. The patient also reports having bone-on-bone arthritis in the knees, which has been treated with cortisone shots as recently as July 9th. He went to urgent care 3 days ago and was started on Keflex for possible skin infection. No signs of abscess or need for I&D at that time.             ROS All review of systems negative except what is listed in the HPI      Objective:    BP (!) 111/55 (BP Location: Right Arm, Patient Position: Sitting, Cuff Size: Large)   Pulse 78   Resp 18   Ht 6' (1.829 m)   Wt 201 lb 3.2 oz (91.3 kg)   SpO2 99%   BMI 27.29 kg/m    Physical Exam Vitals reviewed.  Constitutional:      Appearance: Normal appearance.  Musculoskeletal:        General: Swelling present.     Comments: See picture of right knee inflammation and erythema, seems to be localized near infrapatellar bursa  Skin:    General: Skin is warm and dry.  Neurological:     Mental Status: He is alert and oriented to person, place, and time.  Psychiatric:        Mood and Affect: Mood normal.        Behavior: Behavior normal.        Thought Content: Thought content normal.        Judgment: Judgment normal.          No results found for any visits on 12/05/22.       Assessment & Plan:   Problem List Items Addressed This Visit   None Visit Diagnoses     Pain and swelling of knee, right    -  Primary         Knee Infection Possible skin infection vs infrapatellar bursitis with current episode of inflammation and no signs of abscess formation. No systemic symptoms. Currently on Keflex. -Continue Keflex for the full 7-day course. -Apply ice and compression (using knee sleeve) to the affected area. -Contact clinic if symptoms worsen  -Orthopedic evaluation scheduled for 2 weeks.      No orders of the defined types were placed in this encounter.   Return if symptoms worsen or fail to improve.  Clayborne Dana, NP

## 2023-06-27 DIAGNOSIS — L570 Actinic keratosis: Secondary | ICD-10-CM | POA: Diagnosis not present

## 2023-06-27 DIAGNOSIS — X32XXXA Exposure to sunlight, initial encounter: Secondary | ICD-10-CM | POA: Diagnosis not present

## 2023-07-02 DIAGNOSIS — H35372 Puckering of macula, left eye: Secondary | ICD-10-CM | POA: Diagnosis not present

## 2023-07-02 DIAGNOSIS — Z961 Presence of intraocular lens: Secondary | ICD-10-CM | POA: Diagnosis not present

## 2023-07-02 DIAGNOSIS — H43813 Vitreous degeneration, bilateral: Secondary | ICD-10-CM | POA: Diagnosis not present

## 2023-08-27 DIAGNOSIS — M170 Bilateral primary osteoarthritis of knee: Secondary | ICD-10-CM | POA: Diagnosis not present
